# Patient Record
Sex: Male | Born: 1970 | Race: White | Hispanic: No | Smoking: Current every day smoker
Health system: Southern US, Community
[De-identification: ages and names within clinical notes are randomized; demographics above are authoritative.]

## PROBLEM LIST (undated history)

## (undated) DIAGNOSIS — S81802A Unspecified open wound, left lower leg, initial encounter: Secondary | ICD-10-CM

## (undated) DIAGNOSIS — S82402A Unspecified fracture of shaft of left fibula, initial encounter for closed fracture: Secondary | ICD-10-CM

## (undated) DIAGNOSIS — S143XXA Injury of brachial plexus, initial encounter: Secondary | ICD-10-CM

## (undated) DIAGNOSIS — S32811B Multiple fractures of pelvis with unstable disruption of pelvic ring, initial encounter for open fracture: Secondary | ICD-10-CM

## (undated) DIAGNOSIS — S3210XA Unspecified fracture of sacrum, initial encounter for closed fracture: Secondary | ICD-10-CM

## (undated) HISTORY — PX: WISDOM TOOTH EXTRACTION: SHX21

## (undated) HISTORY — PX: CHOLECYSTECTOMY: SHX55

---

## 2010-02-09 ENCOUNTER — Emergency Department (HOSPITAL_BASED_OUTPATIENT_CLINIC_OR_DEPARTMENT_OTHER): Admission: EM | Admit: 2010-02-09 | Discharge: 2010-02-09 | Payer: Self-pay | Admitting: Emergency Medicine

## 2017-03-30 ENCOUNTER — Inpatient Hospital Stay (HOSPITAL_COMMUNITY): Payer: Commercial Managed Care - PPO | Admitting: Certified Registered Nurse Anesthetist

## 2017-03-30 ENCOUNTER — Emergency Department (HOSPITAL_COMMUNITY): Payer: Commercial Managed Care - PPO

## 2017-03-30 ENCOUNTER — Inpatient Hospital Stay (HOSPITAL_COMMUNITY): Payer: Commercial Managed Care - PPO

## 2017-03-30 ENCOUNTER — Encounter (HOSPITAL_COMMUNITY): Admission: EM | Disposition: A | Discharge status: Home / Self Care | Payer: Self-pay | Source: Home / Self Care

## 2017-03-30 ENCOUNTER — Encounter (HOSPITAL_COMMUNITY): Payer: Self-pay | Admitting: Emergency Medicine

## 2017-03-30 ENCOUNTER — Inpatient Hospital Stay (HOSPITAL_COMMUNITY)
Admission: EM | Admit: 2017-03-30 | Discharge: 2017-04-05 | DRG: 958 | Disposition: A | Discharge status: Home / Self Care | Payer: Commercial Managed Care - PPO | Attending: Orthopedic Surgery | Admitting: Orthopedic Surgery

## 2017-03-30 DIAGNOSIS — D72828 Other elevated white blood cell count: Secondary | ICD-10-CM | POA: Diagnosis not present

## 2017-03-30 DIAGNOSIS — S143XXA Injury of brachial plexus, initial encounter: Secondary | ICD-10-CM | POA: Diagnosis present

## 2017-03-30 DIAGNOSIS — R339 Retention of urine, unspecified: Secondary | ICD-10-CM | POA: Diagnosis not present

## 2017-03-30 DIAGNOSIS — S143XXD Injury of brachial plexus, subsequent encounter: Secondary | ICD-10-CM | POA: Diagnosis not present

## 2017-03-30 DIAGNOSIS — K5900 Constipation, unspecified: Secondary | ICD-10-CM | POA: Diagnosis present

## 2017-03-30 DIAGNOSIS — S32130A Nondisplaced Zone III fracture of sacrum, initial encounter for closed fracture: Secondary | ICD-10-CM | POA: Diagnosis present

## 2017-03-30 DIAGNOSIS — R509 Fever, unspecified: Secondary | ICD-10-CM

## 2017-03-30 DIAGNOSIS — S82402A Unspecified fracture of shaft of left fibula, initial encounter for closed fracture: Secondary | ICD-10-CM | POA: Diagnosis present

## 2017-03-30 DIAGNOSIS — S81822A Laceration with foreign body, left lower leg, initial encounter: Secondary | ICD-10-CM | POA: Diagnosis present

## 2017-03-30 DIAGNOSIS — R791 Abnormal coagulation profile: Secondary | ICD-10-CM | POA: Diagnosis not present

## 2017-03-30 DIAGNOSIS — R030 Elevated blood-pressure reading, without diagnosis of hypertension: Secondary | ICD-10-CM | POA: Diagnosis not present

## 2017-03-30 DIAGNOSIS — Z720 Tobacco use: Secondary | ICD-10-CM

## 2017-03-30 DIAGNOSIS — S332XXA Dislocation of sacroiliac and sacrococcygeal joint, initial encounter: Secondary | ICD-10-CM | POA: Diagnosis present

## 2017-03-30 DIAGNOSIS — S40811A Abrasion of right upper arm, initial encounter: Secondary | ICD-10-CM | POA: Diagnosis present

## 2017-03-30 DIAGNOSIS — S3210XA Unspecified fracture of sacrum, initial encounter for closed fracture: Secondary | ICD-10-CM | POA: Diagnosis present

## 2017-03-30 DIAGNOSIS — D62 Acute posthemorrhagic anemia: Secondary | ICD-10-CM

## 2017-03-30 DIAGNOSIS — N390 Urinary tract infection, site not specified: Secondary | ICD-10-CM | POA: Diagnosis present

## 2017-03-30 DIAGNOSIS — G8918 Other acute postprocedural pain: Secondary | ICD-10-CM | POA: Diagnosis not present

## 2017-03-30 DIAGNOSIS — S12500D Unspecified displaced fracture of sixth cervical vertebra, subsequent encounter for fracture with routine healing: Secondary | ICD-10-CM | POA: Diagnosis not present

## 2017-03-30 DIAGNOSIS — J189 Pneumonia, unspecified organism: Secondary | ICD-10-CM | POA: Diagnosis present

## 2017-03-30 DIAGNOSIS — E871 Hypo-osmolality and hyponatremia: Secondary | ICD-10-CM | POA: Diagnosis present

## 2017-03-30 DIAGNOSIS — S32599A Other specified fracture of unspecified pubis, initial encounter for closed fracture: Secondary | ICD-10-CM

## 2017-03-30 DIAGNOSIS — S12590A Other displaced fracture of sixth cervical vertebra, initial encounter for closed fracture: Secondary | ICD-10-CM | POA: Diagnosis present

## 2017-03-30 DIAGNOSIS — R739 Hyperglycemia, unspecified: Secondary | ICD-10-CM | POA: Diagnosis present

## 2017-03-30 DIAGNOSIS — S0081XD Abrasion of other part of head, subsequent encounter: Secondary | ICD-10-CM | POA: Diagnosis not present

## 2017-03-30 DIAGNOSIS — Z419 Encounter for procedure for purposes other than remedying health state, unspecified: Secondary | ICD-10-CM

## 2017-03-30 DIAGNOSIS — S142XXA Injury of nerve root of cervical spine, initial encounter: Secondary | ICD-10-CM | POA: Diagnosis present

## 2017-03-30 DIAGNOSIS — Y95 Nosocomial condition: Secondary | ICD-10-CM | POA: Diagnosis present

## 2017-03-30 DIAGNOSIS — S0181XA Laceration without foreign body of other part of head, initial encounter: Secondary | ICD-10-CM | POA: Diagnosis present

## 2017-03-30 DIAGNOSIS — D72829 Elevated white blood cell count, unspecified: Secondary | ICD-10-CM | POA: Diagnosis not present

## 2017-03-30 DIAGNOSIS — S32811A Multiple fractures of pelvis with unstable disruption of pelvic ring, initial encounter for closed fracture: Principal | ICD-10-CM | POA: Diagnosis present

## 2017-03-30 DIAGNOSIS — E8809 Other disorders of plasma-protein metabolism, not elsewhere classified: Secondary | ICD-10-CM | POA: Diagnosis present

## 2017-03-30 DIAGNOSIS — D72825 Bandemia: Secondary | ICD-10-CM | POA: Diagnosis not present

## 2017-03-30 DIAGNOSIS — Z23 Encounter for immunization: Secondary | ICD-10-CM

## 2017-03-30 DIAGNOSIS — S82455B Nondisplaced comminuted fracture of shaft of left fibula, initial encounter for open fracture type I or II: Secondary | ICD-10-CM

## 2017-03-30 DIAGNOSIS — S12500A Unspecified displaced fracture of sixth cervical vertebra, initial encounter for closed fracture: Secondary | ICD-10-CM

## 2017-03-30 DIAGNOSIS — S12591S Other nondisplaced fracture of sixth cervical vertebra, sequela: Secondary | ICD-10-CM | POA: Diagnosis not present

## 2017-03-30 DIAGNOSIS — S82452A Displaced comminuted fracture of shaft of left fibula, initial encounter for closed fracture: Secondary | ICD-10-CM | POA: Diagnosis present

## 2017-03-30 DIAGNOSIS — S0003XD Contusion of scalp, subsequent encounter: Secondary | ICD-10-CM | POA: Diagnosis not present

## 2017-03-30 DIAGNOSIS — R7989 Other specified abnormal findings of blood chemistry: Secondary | ICD-10-CM

## 2017-03-30 DIAGNOSIS — I959 Hypotension, unspecified: Secondary | ICD-10-CM | POA: Diagnosis present

## 2017-03-30 DIAGNOSIS — S82402D Unspecified fracture of shaft of left fibula, subsequent encounter for closed fracture with routine healing: Secondary | ICD-10-CM | POA: Diagnosis not present

## 2017-03-30 DIAGNOSIS — F17210 Nicotine dependence, cigarettes, uncomplicated: Secondary | ICD-10-CM | POA: Diagnosis present

## 2017-03-30 DIAGNOSIS — S242XXA Injury of nerve root of thoracic spine, initial encounter: Secondary | ICD-10-CM | POA: Diagnosis present

## 2017-03-30 DIAGNOSIS — R740 Nonspecific elevation of levels of transaminase and lactic acid dehydrogenase [LDH]: Secondary | ICD-10-CM | POA: Diagnosis not present

## 2017-03-30 DIAGNOSIS — E46 Unspecified protein-calorie malnutrition: Secondary | ICD-10-CM | POA: Diagnosis not present

## 2017-03-30 DIAGNOSIS — S3210XD Unspecified fracture of sacrum, subsequent encounter for fracture with routine healing: Secondary | ICD-10-CM | POA: Diagnosis not present

## 2017-03-30 DIAGNOSIS — S143XXS Injury of brachial plexus, sequela: Secondary | ICD-10-CM | POA: Diagnosis not present

## 2017-03-30 DIAGNOSIS — S32810D Multiple fractures of pelvis with stable disruption of pelvic ring, subsequent encounter for fracture with routine healing: Secondary | ICD-10-CM | POA: Diagnosis present

## 2017-03-30 DIAGNOSIS — S32811B Multiple fractures of pelvis with unstable disruption of pelvic ring, initial encounter for open fracture: Secondary | ICD-10-CM | POA: Diagnosis present

## 2017-03-30 DIAGNOSIS — B9620 Unspecified Escherichia coli [E. coli] as the cause of diseases classified elsewhere: Secondary | ICD-10-CM | POA: Diagnosis present

## 2017-03-30 DIAGNOSIS — F1010 Alcohol abuse, uncomplicated: Secondary | ICD-10-CM

## 2017-03-30 DIAGNOSIS — M792 Neuralgia and neuritis, unspecified: Secondary | ICD-10-CM | POA: Diagnosis not present

## 2017-03-30 DIAGNOSIS — S12500S Unspecified displaced fracture of sixth cervical vertebra, sequela: Secondary | ICD-10-CM | POA: Diagnosis not present

## 2017-03-30 DIAGNOSIS — R8290 Unspecified abnormal findings in urine: Secondary | ICD-10-CM | POA: Diagnosis not present

## 2017-03-30 DIAGNOSIS — M25511 Pain in right shoulder: Secondary | ICD-10-CM | POA: Diagnosis present

## 2017-03-30 DIAGNOSIS — T148XXA Other injury of unspecified body region, initial encounter: Secondary | ICD-10-CM

## 2017-03-30 DIAGNOSIS — S81802A Unspecified open wound, left lower leg, initial encounter: Secondary | ICD-10-CM | POA: Diagnosis present

## 2017-03-30 HISTORY — DX: Unspecified open wound, left lower leg, initial encounter: S81.802A

## 2017-03-30 HISTORY — DX: Multiple fractures of pelvis with unstable disruption of pelvic ring, initial encounter for open fracture: S32.811B

## 2017-03-30 HISTORY — PX: SACRO-ILIAC PINNING: SHX5050

## 2017-03-30 HISTORY — DX: Unspecified fracture of sacrum, initial encounter for closed fracture: S32.10XA

## 2017-03-30 HISTORY — PX: ORIF PELVIC FRACTURE: SHX2128

## 2017-03-30 HISTORY — DX: Injury of brachial plexus, initial encounter: S14.3XXA

## 2017-03-30 HISTORY — DX: Unspecified fracture of shaft of left fibula, initial encounter for closed fracture: S82.402A

## 2017-03-30 LAB — COMPREHENSIVE METABOLIC PANEL
ALBUMIN: 3.5 g/dL (ref 3.5–5.0)
ALBUMIN: 3.3 g/dL — AB (ref 3.5–5.0)
ALT: 50 U/L (ref 17–63)
ALT: 164 U/L — AB (ref 17–63)
ANION GAP: 12 (ref 5–15)
AST: 114 U/L — ABNORMAL HIGH (ref 15–41)
AST: 313 U/L — AB (ref 15–41)
Alkaline Phosphatase: 61 U/L (ref 38–126)
Alkaline Phosphatase: 39 U/L (ref 38–126)
Anion gap: 6 (ref 5–15)
BILIRUBIN TOTAL: 1.5 mg/dL — AB (ref 0.3–1.2)
BILIRUBIN TOTAL: 1.8 mg/dL — AB (ref 0.3–1.2)
BUN: 10 mg/dL (ref 6–20)
BUN: 8 mg/dL (ref 6–20)
CALCIUM: 8.2 mg/dL — AB (ref 8.9–10.3)
CHLORIDE: 108 mmol/L (ref 101–111)
CO2: 19 mmol/L — ABNORMAL LOW (ref 22–32)
CO2: 21 mmol/L — ABNORMAL LOW (ref 22–32)
Calcium: 7.8 mg/dL — ABNORMAL LOW (ref 8.9–10.3)
Chloride: 105 mmol/L (ref 101–111)
Creatinine, Ser: 0.88 mg/dL (ref 0.61–1.24)
Creatinine, Ser: 0.83 mg/dL (ref 0.61–1.24)
GFR calc Af Amer: >60 mL/min (ref >60)
GFR calc Af Amer: >60 mL/min (ref >60)
GFR calc non Af Amer: >60 mL/min (ref >60)
GLUCOSE: 125 mg/dL — AB (ref 65–99)
GLUCOSE: 168 mg/dL — AB (ref 65–99)
POTASSIUM: 4.6 mmol/L (ref 3.5–5.1)
POTASSIUM: 3.8 mmol/L (ref 3.5–5.1)
Sodium: 136 mmol/L (ref 135–145)
Sodium: 135 mmol/L (ref 135–145)
TOTAL PROTEIN: 5.3 g/dL — AB (ref 6.5–8.1)
Total Protein: 4.9 g/dL — ABNORMAL LOW (ref 6.5–8.1)

## 2017-03-30 LAB — CDS SEROLOGY

## 2017-03-30 LAB — ABO/RH: ABO/RH(D): O NEG

## 2017-03-30 LAB — PREPARE RBC (CROSSMATCH)

## 2017-03-30 LAB — CBC
HCT: 38.8 % — ABNORMAL LOW (ref 39.0–52.0)
HEMATOCRIT: 33.8 % — AB (ref 39.0–52.0)
HEMOGLOBIN: 13.3 g/dL (ref 13.0–17.0)
Hemoglobin: 11.7 g/dL — ABNORMAL LOW (ref 13.0–17.0)
MCH: 32.2 pg (ref 26.0–34.0)
MCH: 30.8 pg (ref 26.0–34.0)
MCHC: 34.3 g/dL (ref 30.0–36.0)
MCHC: 34.6 g/dL (ref 30.0–36.0)
MCV: 93.9 fL (ref 78.0–100.0)
MCV: 88.9 fL (ref 78.0–100.0)
PLATELETS: 145 10*3/uL — AB (ref 150–400)
Platelets: 237 10*3/uL (ref 150–400)
RBC: 4.13 MIL/uL — ABNORMAL LOW (ref 4.22–5.81)
RBC: 3.8 MIL/uL — AB (ref 4.22–5.81)
RDW: 13 % (ref 11.5–15.5)
RDW: 16.1 % — AB (ref 11.5–15.5)
WBC: 22 10*3/uL — AB (ref 4.0–10.5)
WBC: 10.2 10*3/uL (ref 4.0–10.5)

## 2017-03-30 LAB — LACTIC ACID, PLASMA: LACTIC ACID, VENOUS: 2.8 mmol/L — AB (ref 0.5–1.9)

## 2017-03-30 LAB — ETHANOL

## 2017-03-30 LAB — SAMPLE TO BLOOD BANK

## 2017-03-30 LAB — DIFFERENTIAL
Basophils Absolute: 0.1 10*3/uL (ref 0.0–0.1)
Basophils Relative: 0 %
EOS ABS: 0.3 10*3/uL (ref 0.0–0.7)
EOS PCT: 1 %
LYMPHS PCT: 22 %
Lymphs Abs: 4.8 10*3/uL — ABNORMAL HIGH (ref 0.7–4.0)
MONO ABS: 1.8 10*3/uL — AB (ref 0.1–1.0)
Monocytes Relative: 8 %
Neutro Abs: 15.2 10*3/uL — ABNORMAL HIGH (ref 1.7–7.7)
Neutrophils Relative %: 69 %

## 2017-03-30 LAB — I-STAT CG4 LACTIC ACID, ED: Lactic Acid, Venous: 2.96 mmol/L (ref 0.5–1.9)

## 2017-03-30 LAB — MRSA PCR SCREENING: MRSA by PCR: POSITIVE — AB

## 2017-03-30 LAB — PROTIME-INR
INR: 1.08
PROTHROMBIN TIME: 14 s (ref 11.4–15.2)

## 2017-03-30 SURGERY — OPEN REDUCTION INTERNAL FIXATION (ORIF) PELVIC FRACTURE
Anesthesia: General | Laterality: Left

## 2017-03-30 MED ORDER — PROMETHAZINE HCL 25 MG/ML IJ SOLN: 6.2500 mg | INTRAMUSCULAR | Status: DC | PRN

## 2017-03-30 MED ORDER — IOPAMIDOL (ISOVUE-300) INJECTION 61%
INTRAVENOUS | Status: AC
  Administered 2017-03-30: 100 mL
  Filled 2017-03-30: qty 100

## 2017-03-30 MED ORDER — FENTANYL CITRATE (PF) 100 MCG/2ML IJ SOLN
50.0000 ug | Freq: Once | INTRAMUSCULAR | Status: AC
  Administered 2017-03-30: 50 ug via INTRAVENOUS

## 2017-03-30 MED ORDER — ALBUMIN HUMAN 5 % IV SOLN
12.5000 g | Freq: Once | INTRAVENOUS | Status: AC
  Administered 2017-03-30: 12.5 g via INTRAVENOUS
  Filled 2017-03-30: qty 250

## 2017-03-30 MED ORDER — ONDANSETRON HCL 4 MG/2ML IJ SOLN
INTRAMUSCULAR | Status: DC | PRN
  Administered 2017-03-30: 4 mg via INTRAVENOUS

## 2017-03-30 MED ORDER — HYDROMORPHONE HCL 1 MG/ML IJ SOLN
1.0000 mg | INTRAMUSCULAR | Status: DC | PRN
  Administered 2017-03-30: 1 mg via INTRAVENOUS

## 2017-03-30 MED ORDER — MUPIROCIN 2 % EX OINT
1.0000 "application " | TOPICAL_OINTMENT | Freq: Two times a day (BID) | CUTANEOUS | Status: AC
  Administered 2017-03-31 – 2017-04-04 (×10): 1 via NASAL
  Filled 2017-03-30 (×2): qty 22

## 2017-03-30 MED ORDER — CHLORHEXIDINE GLUCONATE CLOTH 2 % EX PADS
6.0000 | MEDICATED_PAD | Freq: Every day | CUTANEOUS | Status: AC
  Administered 2017-03-31 – 2017-04-04 (×5): 6 via TOPICAL

## 2017-03-30 MED ORDER — ONDANSETRON 4 MG PO TBDP
4.0000 mg | ORAL_TABLET | Freq: Four times a day (QID) | ORAL | Status: DC | PRN
  Administered 2017-04-03: 4 mg via ORAL
  Filled 2017-03-30 (×2): qty 1

## 2017-03-30 MED ORDER — SODIUM CHLORIDE 0.9 % IV BOLUS (SEPSIS)
1000.0000 mL | Freq: Once | INTRAVENOUS | Status: AC
  Administered 2017-03-30: 1000 mL via INTRAVENOUS

## 2017-03-30 MED ORDER — CEFAZOLIN SODIUM-DEXTROSE 1-4 GM/50ML-% IV SOLN
1.0000 g | Freq: Once | INTRAVENOUS | Status: AC
  Administered 2017-03-30: 1 g via INTRAVENOUS
  Filled 2017-03-30: qty 50

## 2017-03-30 MED ORDER — ONDANSETRON HCL 4 MG/2ML IJ SOLN
4.0000 mg | Freq: Once | INTRAMUSCULAR | Status: AC
  Administered 2017-03-30: 4 mg via INTRAVENOUS
  Filled 2017-03-30: qty 2

## 2017-03-30 MED ORDER — ROCURONIUM BROMIDE 100 MG/10ML IV SOLN
INTRAVENOUS | Status: DC | PRN
  Administered 2017-03-30: 10 mg via INTRAVENOUS
  Administered 2017-03-30: 20 mg via INTRAVENOUS
  Administered 2017-03-30: 30 mg via INTRAVENOUS
  Administered 2017-03-30: 50 mg via INTRAVENOUS

## 2017-03-30 MED ORDER — DIPHENHYDRAMINE HCL 12.5 MG/5ML PO ELIX: 12.5000 mg | ORAL_SOLUTION | Freq: Four times a day (QID) | ORAL | Status: DC | PRN

## 2017-03-30 MED ORDER — SODIUM CHLORIDE 0.9% FLUSH: 9.0000 mL | INTRAVENOUS | Status: DC | PRN

## 2017-03-30 MED ORDER — LACTATED RINGERS IV SOLN
INTRAVENOUS | Status: DC
  Administered 2017-03-30 (×2): via INTRAVENOUS

## 2017-03-30 MED ORDER — ONDANSETRON HCL 4 MG/2ML IJ SOLN
4.0000 mg | Freq: Four times a day (QID) | INTRAMUSCULAR | Status: DC | PRN
Start: 2017-03-30 — End: 2017-04-05
  Administered 2017-03-30: 4 mg via INTRAVENOUS
  Filled 2017-03-30: qty 2

## 2017-03-30 MED ORDER — FENTANYL CITRATE (PF) 100 MCG/2ML IJ SOLN
INTRAMUSCULAR | Status: DC | PRN
  Administered 2017-03-30 (×5): 50 ug via INTRAVENOUS

## 2017-03-30 MED ORDER — HYDROMORPHONE HCL 1 MG/ML IJ SOLN
INTRAMUSCULAR | Status: AC
  Filled 2017-03-30: qty 1

## 2017-03-30 MED ORDER — LIDOCAINE-EPINEPHRINE (PF) 2 %-1:200000 IJ SOLN
10.0000 mL | Freq: Once | INTRAMUSCULAR | Status: AC
  Administered 2017-03-30: 10 mL

## 2017-03-30 MED ORDER — TETANUS-DIPHTH-ACELL PERTUSSIS 5-2.5-18.5 LF-MCG/0.5 IM SUSP
0.5000 mL | Freq: Once | INTRAMUSCULAR | Status: AC
  Administered 2017-03-30: 0.5 mL via INTRAMUSCULAR
  Filled 2017-03-30: qty 0.5

## 2017-03-30 MED ORDER — SODIUM CHLORIDE 0.9 % IR SOLN
Status: DC | PRN
  Administered 2017-03-30: 3000 mL

## 2017-03-30 MED ORDER — KCL IN DEXTROSE-NACL 20-5-0.45 MEQ/L-%-% IV SOLN
INTRAVENOUS | Status: DC
  Administered 2017-03-30 – 2017-04-02 (×3): via INTRAVENOUS
  Filled 2017-03-30 (×6): qty 1000

## 2017-03-30 MED ORDER — HYDROMORPHONE 1 MG/ML IV SOLN
INTRAVENOUS | Status: DC
  Administered 2017-03-30: 25 mg via INTRAVENOUS
  Administered 2017-03-31: 1.2 mg via INTRAVENOUS
  Administered 2017-03-31: 3 mg via INTRAVENOUS
  Administered 2017-03-31: 2 mg via INTRAVENOUS
  Administered 2017-04-01 (×2): 1.8 mg via INTRAVENOUS
  Administered 2017-04-01: 0.9 mg via INTRAVENOUS
  Filled 2017-03-30: qty 25

## 2017-03-30 MED ORDER — SUGAMMADEX SODIUM 200 MG/2ML IV SOLN
INTRAVENOUS | Status: DC | PRN
  Administered 2017-03-30: 200 mg via INTRAVENOUS

## 2017-03-30 MED ORDER — HYDRALAZINE HCL 20 MG/ML IJ SOLN: 10.0000 mg | INTRAMUSCULAR | Status: DC | PRN

## 2017-03-30 MED ORDER — HYDROMORPHONE HCL 1 MG/ML IJ SOLN
0.5000 mg | INTRAMUSCULAR | Status: DC | PRN
  Administered 2017-03-30: 0.5 mg via INTRAVENOUS
  Filled 2017-03-30: qty 0.5

## 2017-03-30 MED ORDER — CEFAZOLIN SODIUM-DEXTROSE 2-4 GM/100ML-% IV SOLN
2.0000 g | INTRAVENOUS | Status: AC
  Administered 2017-03-30: 2 g via INTRAVENOUS

## 2017-03-30 MED ORDER — CEFAZOLIN SODIUM-DEXTROSE 2-4 GM/100ML-% IV SOLN
INTRAVENOUS | Status: AC
  Filled 2017-03-30: qty 100

## 2017-03-30 MED ORDER — PHENYLEPHRINE HCL 10 MG/ML IJ SOLN
INTRAVENOUS | Status: DC | PRN
  Administered 2017-03-30: 80 ug/min via INTRAVENOUS

## 2017-03-30 MED ORDER — MIDAZOLAM HCL 5 MG/5ML IJ SOLN
INTRAMUSCULAR | Status: DC | PRN
  Administered 2017-03-30: 2 mg via INTRAVENOUS

## 2017-03-30 MED ORDER — BISACODYL 10 MG RE SUPP: 10.0000 mg | Freq: Every day | RECTAL | Status: DC | PRN

## 2017-03-30 MED ORDER — CHLORHEXIDINE GLUCONATE 4 % EX LIQD: 60.0000 mL | Freq: Once | CUTANEOUS | Status: DC

## 2017-03-30 MED ORDER — PANTOPRAZOLE SODIUM 40 MG IV SOLR
40.0000 mg | Freq: Every day | INTRAVENOUS | Status: DC
  Administered 2017-03-31: 40 mg via INTRAVENOUS
  Filled 2017-03-30: qty 40

## 2017-03-30 MED ORDER — FENTANYL CITRATE (PF) 100 MCG/2ML IJ SOLN
INTRAMUSCULAR | Status: AC
  Filled 2017-03-30: qty 2

## 2017-03-30 MED ORDER — MIDAZOLAM HCL 2 MG/2ML IJ SOLN
INTRAMUSCULAR | Status: AC
  Filled 2017-03-30: qty 2

## 2017-03-30 MED ORDER — CEFAZOLIN SODIUM-DEXTROSE 1-4 GM/50ML-% IV SOLN
1.0000 g | Freq: Three times a day (TID) | INTRAVENOUS | Status: AC
  Administered 2017-03-30 – 2017-04-02 (×9): 1 g via INTRAVENOUS
  Filled 2017-03-30 (×11): qty 50

## 2017-03-30 MED ORDER — DEXAMETHASONE SODIUM PHOSPHATE 10 MG/ML IJ SOLN
INTRAMUSCULAR | Status: DC | PRN
  Administered 2017-03-30: 10 mg via INTRAVENOUS

## 2017-03-30 MED ORDER — ALBUMIN HUMAN 5 % IV SOLN
INTRAVENOUS | Status: DC | PRN
  Administered 2017-03-30 (×2): via INTRAVENOUS

## 2017-03-30 MED ORDER — MORPHINE SULFATE (PF) 4 MG/ML IV SOLN
4.0000 mg | INTRAVENOUS | Status: DC | PRN
  Administered 2017-03-30 (×2): 4 mg via INTRAVENOUS
  Filled 2017-03-30 (×2): qty 1

## 2017-03-30 MED ORDER — EPHEDRINE 5 MG/ML INJ
INTRAVENOUS | Status: AC
  Filled 2017-03-30: qty 10

## 2017-03-30 MED ORDER — FENTANYL CITRATE (PF) 250 MCG/5ML IJ SOLN
INTRAMUSCULAR | Status: AC
  Filled 2017-03-30: qty 5

## 2017-03-30 MED ORDER — ONDANSETRON HCL 4 MG/2ML IJ SOLN
INTRAMUSCULAR | Status: AC
  Filled 2017-03-30: qty 2

## 2017-03-30 MED ORDER — SUCCINYLCHOLINE CHLORIDE 200 MG/10ML IV SOSY
PREFILLED_SYRINGE | INTRAVENOUS | Status: AC
  Filled 2017-03-30: qty 10

## 2017-03-30 MED ORDER — LACTATED RINGERS IV SOLN: INTRAVENOUS | Status: DC | PRN

## 2017-03-30 MED ORDER — HYDROMORPHONE HCL 1 MG/ML IJ SOLN: 0.2500 mg | INTRAMUSCULAR | Status: DC | PRN

## 2017-03-30 MED ORDER — DIPHENHYDRAMINE HCL 50 MG/ML IJ SOLN: 12.5000 mg | Freq: Four times a day (QID) | INTRAMUSCULAR | Status: DC | PRN

## 2017-03-30 MED ORDER — PROPOFOL 10 MG/ML IV BOLUS
INTRAVENOUS | Status: DC | PRN
  Administered 2017-03-30: 100 mg via INTRAVENOUS

## 2017-03-30 MED ORDER — SUCCINYLCHOLINE CHLORIDE 200 MG/10ML IV SOSY
PREFILLED_SYRINGE | INTRAVENOUS | Status: DC | PRN
  Administered 2017-03-30: 100 mg via INTRAVENOUS

## 2017-03-30 MED ORDER — FENTANYL CITRATE (PF) 100 MCG/2ML IJ SOLN
INTRAMUSCULAR | Status: AC
  Administered 2017-03-30: 100 ug via INTRAVENOUS
  Filled 2017-03-30: qty 2

## 2017-03-30 MED ORDER — PANTOPRAZOLE SODIUM 40 MG PO TBEC
40.0000 mg | DELAYED_RELEASE_TABLET | Freq: Every day | ORAL | Status: DC
  Administered 2017-04-01 – 2017-04-05 (×5): 40 mg via ORAL
  Filled 2017-03-30 (×5): qty 1

## 2017-03-30 MED ORDER — PROPOFOL 10 MG/ML IV BOLUS
INTRAVENOUS | Status: AC
  Filled 2017-03-30: qty 20

## 2017-03-30 MED ORDER — LIDOCAINE HCL (CARDIAC) 20 MG/ML IV SOLN
INTRAVENOUS | Status: DC | PRN
  Administered 2017-03-30: 80 mg via INTRAVENOUS

## 2017-03-30 MED ORDER — FENTANYL CITRATE (PF) 100 MCG/2ML IJ SOLN
100.0000 ug | Freq: Once | INTRAMUSCULAR | Status: AC
  Administered 2017-03-30: 100 ug via INTRAVENOUS

## 2017-03-30 MED ORDER — NALOXONE HCL 0.4 MG/ML IJ SOLN: 0.4000 mg | INTRAMUSCULAR | Status: DC | PRN

## 2017-03-30 MED ORDER — SODIUM CHLORIDE 0.9 % IV SOLN
Freq: Once | INTRAVENOUS | Status: AC
  Administered 2017-03-30: 14:00:00 via INTRAVENOUS

## 2017-03-30 SURGICAL SUPPLY — 76 items
BIT DRILL 5.6 (BIT) ×1 IMPLANT
BIT DRILL SCALD PELVS II 2.5MM (BIT) ×1 IMPLANT
BLADE CLIPPER SURG (BLADE) ×6 IMPLANT
BRUSH SCRUB SURG 4.25 DISP (MISCELLANEOUS) ×12 IMPLANT
COVER SURGICAL LIGHT HANDLE (MISCELLANEOUS) ×6 IMPLANT
DRAIN CHANNEL 15F RND FF W/TCR (WOUND CARE) IMPLANT
DRAPE C-ARM 42X72 X-RAY (DRAPES) ×3 IMPLANT
DRAPE C-ARMOR (DRAPES) ×3 IMPLANT
DRAPE EXTREMITY T 121X128X90 (DRAPE) ×3 IMPLANT
DRAPE INCISE IOBAN 66X45 STRL (DRAPES) ×3 IMPLANT
DRAPE LAPAROTOMY TRNSV 102X78 (DRAPE) ×3 IMPLANT
DRAPE PROXIMA HALF (DRAPES) ×6 IMPLANT
DRAPE SURG 17X23 STRL (DRAPES) ×3 IMPLANT
DRAPE U-SHAPE 47X51 STRL (DRAPES) ×3 IMPLANT
DRILL 5.6 (BIT) ×3
DRILL SCALED PELVIS II 2.5MM (BIT) ×3
DRSG MEPILEX BORDER 4X4 (GAUZE/BANDAGES/DRESSINGS) ×3 IMPLANT
DRSG MEPITEL 4X7.2 (GAUZE/BANDAGES/DRESSINGS) ×3 IMPLANT
DRSG VAC ATS MED SENSATRAC (GAUZE/BANDAGES/DRESSINGS) ×3 IMPLANT
ELECT BLADE 4.0 EZ CLEAN MEGAD (MISCELLANEOUS) ×3
ELECT REM PT RETURN 9FT ADLT (ELECTROSURGICAL) ×3
ELECTRODE BLDE 4.0 EZ CLN MEGD (MISCELLANEOUS) ×1 IMPLANT
ELECTRODE REM PT RTRN 9FT ADLT (ELECTROSURGICAL) ×1 IMPLANT
EVACUATOR SILICONE 100CC (DRAIN) IMPLANT
GLOVE BIO SURGEON STRL SZ7.5 (GLOVE) ×6 IMPLANT
GLOVE BIO SURGEON STRL SZ8 (GLOVE) ×6 IMPLANT
GLOVE BIOGEL PI IND STRL 7.5 (GLOVE) ×2 IMPLANT
GLOVE BIOGEL PI IND STRL 8 (GLOVE) ×2 IMPLANT
GLOVE BIOGEL PI INDICATOR 7.5 (GLOVE) ×4
GLOVE BIOGEL PI INDICATOR 8 (GLOVE) ×4
GOWN STRL REUS W/ TWL LRG LVL3 (GOWN DISPOSABLE) ×4 IMPLANT
GOWN STRL REUS W/ TWL XL LVL3 (GOWN DISPOSABLE) ×2 IMPLANT
GOWN STRL REUS W/TWL LRG LVL3 (GOWN DISPOSABLE) ×8
GOWN STRL REUS W/TWL XL LVL3 (GOWN DISPOSABLE) ×4
GUIDEWIRE ASNIS 3.2 NONCAL (WIRE) ×9 IMPLANT
HANDPIECE INTERPULSE COAX TIP (DISPOSABLE) ×2
IV NS IRRIG 3000ML ARTHROMATIC (IV SOLUTION) ×3 IMPLANT
KIT BASIN OR (CUSTOM PROCEDURE TRAY) ×3 IMPLANT
KIT ROOM TURNOVER OR (KITS) ×3 IMPLANT
MANIFOLD NEPTUNE II (INSTRUMENTS) ×3 IMPLANT
NS IRRIG 1000ML POUR BTL (IV SOLUTION) ×3 IMPLANT
PACK GENERAL/GYN (CUSTOM PROCEDURE TRAY) ×3 IMPLANT
PACK TOTAL JOINT (CUSTOM PROCEDURE TRAY) ×3 IMPLANT
PAD ARMBOARD 7.5X6 YLW CONV (MISCELLANEOUS) ×6 IMPLANT
PLATE SYMPHYSIS 92.5M 6H (Plate) ×3 IMPLANT
SCREW 3.5X46MM (Screw) ×3 IMPLANT
SCREW BONE CANN 8.0X155MM (Screw) ×3 IMPLANT
SCREW CANNULATED 8.0X145MM (Screw) ×3 IMPLANT
SCREW CANNULATED 8.0X170MM (Screw) ×3 IMPLANT
SCREW CORTEX ST MATTA 3.5X34MM (Screw) ×3 IMPLANT
SCREW CORTEX ST MATTA 3.5X36MM (Screw) ×3 IMPLANT
SCREW CORTEX ST MATTA 3.5X38M (Screw) ×3 IMPLANT
SCREW CORTEX ST MATTA 3.5X50MM (Screw) ×6 IMPLANT
SET HNDPC FAN SPRY TIP SCT (DISPOSABLE) ×1 IMPLANT
SPONGE LAP 18X18 X RAY DECT (DISPOSABLE) ×3 IMPLANT
STAPLER VISISTAT 35W (STAPLE) ×3 IMPLANT
SUCTION FRAZIER HANDLE 10FR (MISCELLANEOUS) ×2
SUCTION TUBE FRAZIER 10FR DISP (MISCELLANEOUS) ×1 IMPLANT
SUT ETHILON 2 0 PSLX (SUTURE) ×6 IMPLANT
SUT ETHILON 3 0 PS 1 (SUTURE) IMPLANT
SUT PDS AB 2-0 CT1 27 (SUTURE) ×3 IMPLANT
SUT VIC AB 0 CT1 27 (SUTURE) ×4
SUT VIC AB 0 CT1 27XBRD ANBCTR (SUTURE) ×2 IMPLANT
SUT VIC AB 1 CT1 18XCR BRD 8 (SUTURE) IMPLANT
SUT VIC AB 1 CT1 8-18 (SUTURE)
SUT VIC AB 2-0 CT1 27 (SUTURE)
SUT VIC AB 2-0 CT1 TAPERPNT 27 (SUTURE) IMPLANT
SUT VIC AB 2-0 FS1 27 (SUTURE) IMPLANT
TOWEL OR 17X24 6PK STRL BLUE (TOWEL DISPOSABLE) ×3 IMPLANT
TOWEL OR 17X26 10 PK STRL BLUE (TOWEL DISPOSABLE) ×6 IMPLANT
TRAY FOLEY W/METER SILVER 16FR (SET/KITS/TRAYS/PACK) ×3 IMPLANT
UNDERPAD 30X30 (UNDERPADS AND DIAPERS) ×6 IMPLANT
WASHER SCREW MATTA SS 13.0X1.5 (Washer) ×6 IMPLANT
WATER STERILE IRR 1000ML POUR (IV SOLUTION) ×3 IMPLANT
WND VAC CANISTER 500ML (MISCELLANEOUS) ×3 IMPLANT
YANKAUER SUCT BULB TIP NO VENT (SUCTIONS) ×3 IMPLANT

## 2017-03-30 NOTE — ED Notes (Signed)
Patient transported to CT scan with RN.

## 2017-03-30 NOTE — Anesthesia Procedure Notes (Signed)
Arterial Line Insertion Performed by: MASSAGEE, TERRY, LEE, HEATHER B, CRNA  Preanesthetic checklist: patient identified, IV checked, site marked, risks and benefits discussed, surgical consent, monitors and equipment checked, pre-op evaluation and timeout performed Left, radial was placed Catheter size: 20 G Hand hygiene performed  and maximum sterile barriers used  Allen's test indicative of satisfactory collateral circulation Procedure performed without using ultrasound guided technique. Following insertion, dressing applied and Biopatch. Post procedure assessment: normal  Patient tolerated the procedure well with no immediate complications.

## 2017-03-30 NOTE — Progress Notes (Signed)
Orthopedic Tech Progress Note Patient Details:  Ricardo RummageRichard Friedman 12/05/1970 [id number]  Ortho Devices Type of Ortho Device: Arm sling Ortho Device/Splint Location: RUE Ortho Device/Splint Interventions: Ordered, Application   Jennye MoccasinHughes, [name] 03/30/2017, 6:45 PM
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
## 2017-03-30 NOTE — Transfer of Care (Signed)
Immediate Anesthesia Transfer of Care Note  Patient: Ricardo Friedman  Procedure(s) Performed: Procedure(s): OPEN REDUCTION INTERNAL FIXATION (ORIF) PELVIC FRACTURE/SYMPHYSIS PUBIS TRANSSACRAL SCREW, OPEN FIBULA FRACTURE (Left) SACRO-ILIAC PINNING (Left)  Patient Location: PACU  Anesthesia Type:General  Level of Consciousness: awake, sedated, patient cooperative and responds to stimulation  Airway & Oxygen Therapy: Patient Spontanous Breathing and Patient connected to nasal cannula oxygen  Post-op Assessment: Report given to RN and Post -op Vital signs reviewed and stable  Post vital signs: Reviewed and stable  Last Vitals:  Vitals:   03/30/17 0915 03/30/17 0945  BP: 119/81 (!) 113/102  Pulse: (!) 101 (!) 102  Resp: (!) 23 (!) 22  Temp:      Last Pain:  Vitals:   03/30/17 0954  TempSrc:   PainSc: 5          Complications: No apparent anesthesia complications
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
## 2017-03-30 NOTE — Progress Notes (Signed)
CRITICAL VALUE ALERT  Critical Value: lactic acid - 2.8  Date & Time Notied: 03/30/2017 @ 1928  Provider Notified: On-call Trauma MD - Janee Mornhompson @ 1930  Orders Received/Actions taken: Orders to administer 5% Albumin 12.5g bolus and obtain a CBC - then to notify trauma MD if any of those result critical.   Also given orders at that time for a PCA pump to help alleviate patients on-going/constant pain.  Will complete all orders/tasks and continue to monitor closely.   Francia GreavesSavannah R Granville Whitefield, RN

## 2017-03-30 NOTE — Progress Notes (Signed)
Patient ID: Ricardo RummageRichard Scantling, male   DOB: 04/15/1971, [age] y.o.   MRN: [id number] Patient examined and recovered postoperatively moving left upper cavity well in both lower extremity is well no change in neurologic exam postoperatively.
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
## 2017-03-30 NOTE — Progress Notes (Signed)
Orthopedic Tech Progress Note Patient Details:  Ricardo RummageRichard Friedman 12/04/1970 [id number]  Patient ID: Ricardo Friedman, male   DOB: 10/03/1971, 46 y.o.   MRN: [id number] Assisted with dressing application to open tib fib fx. Dr wanted to leave existing ems splint on leg.  Trinna PostMartinez, [name] 03/30/2017, 6:48 AM

## 2017-03-30 NOTE — ED Notes (Signed)
2ND Unit of emergency release given unit # [id number] 18 [id number] 5, volume 328, exp date 04/07/2017 O-neg. Verified with Jeanice LimHolly, Charity fundraiserN, given via [facility].
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
## 2017-03-30 NOTE — ED Notes (Signed)
Spoke with Dr. Janee Mornhompson on phone made aware BP 89/67, reports to give 2 units of blood via [facility]. Will do emergency release since patient is not type and screened.
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
## 2017-03-30 NOTE — ED Notes (Signed)
OR ready for patient. [facility] to [phone]. Will give report at bedside.

## 2017-03-30 NOTE — ED Notes (Signed)
LSB removed by Dr. Preston FleetingGlick .

## 2017-03-30 NOTE — Procedures (Signed)
Informed consent was obtained before procedure started.  The area was prepped and draped in the usual sterile fashion. Local anesthesia was achieved using 3cc of Lidocaine 2% with epinephrine. The wound was copiously irrigated. 5 3-0 prolene interrupted sutures were placed. Estimated blood loss was less than 0.5 mL. A dressing was applied to the area. The patient tolerated the procedure well without complications or blood loss.  Follow-up visit will be set for suture removal and evaluation of the laceration.  Vanice SarahLauren Alixandria Friedt PA-S
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
## 2017-03-30 NOTE — Anesthesia Postprocedure Evaluation (Signed)
Anesthesia Post Note  Patient: Ricardo Friedman  Procedure(s) Performed: Procedure(s) (LRB): OPEN REDUCTION INTERNAL FIXATION (ORIF) PELVIC FRACTURE/SYMPHYSIS PUBIS TRANSSACRAL SCREW, OPEN FIBULA FRACTURE (Left) SACRO-ILIAC PINNING (Left)     Patient location during evaluation: PACU Anesthesia Type: General Level of consciousness: awake and alert Pain management: pain level controlled Vital Signs Assessment: post-procedure vital signs reviewed and stable Respiratory status: spontaneous breathing, nonlabored ventilation, respiratory function stable and patient connected to nasal cannula oxygen Cardiovascular status: blood pressure returned to baseline and stable Postop Assessment: no signs of nausea or vomiting Anesthetic complications: no    Last Vitals:  Vitals:   03/30/17 1715 03/30/17 1730  BP: 121/81 116/73  Pulse: 92 94  Resp: (!) 22 20  Temp:  37.3 C    Last Pain:  Vitals:   03/30/17 1730  TempSrc:   PainSc: 0-No pain    LLE Motor Response: Purposeful movement;Responds to commands (wiggles toes) (03/30/17 1730) LLE Sensation: Full sensation (03/30/17 1730) RLE Motor Response: Purposeful movement;Responds to commands (wiggles toes) (03/30/17 1730) RLE Sensation: Full sensation (03/30/17 1730)      Cecile HearingStephen Edward Malon Branton

## 2017-03-31 ENCOUNTER — Encounter (HOSPITAL_COMMUNITY): Payer: Self-pay | Admitting: Orthopedic Surgery

## 2017-03-31 DIAGNOSIS — S81802A Unspecified open wound, left lower leg, initial encounter: Secondary | ICD-10-CM

## 2017-03-31 DIAGNOSIS — S3210XA Unspecified fracture of sacrum, initial encounter for closed fracture: Secondary | ICD-10-CM

## 2017-03-31 DIAGNOSIS — S82402A Unspecified fracture of shaft of left fibula, initial encounter for closed fracture: Secondary | ICD-10-CM

## 2017-03-31 DIAGNOSIS — S32811B Multiple fractures of pelvis with unstable disruption of pelvic ring, initial encounter for open fracture: Secondary | ICD-10-CM

## 2017-03-31 DIAGNOSIS — S143XXA Injury of brachial plexus, initial encounter: Secondary | ICD-10-CM

## 2017-03-31 HISTORY — DX: Unspecified fracture of shaft of left fibula, initial encounter for closed fracture: S82.402A

## 2017-03-31 HISTORY — DX: Unspecified fracture of sacrum, initial encounter for closed fracture: S32.10XA

## 2017-03-31 HISTORY — DX: Multiple fractures of pelvis with unstable disruption of pelvic ring, initial encounter for open fracture: S32.811B

## 2017-03-31 HISTORY — DX: Injury of brachial plexus, initial encounter: S14.3XXA

## 2017-03-31 HISTORY — DX: Unspecified open wound, left lower leg, initial encounter: S81.802A

## 2017-03-31 LAB — COMPREHENSIVE METABOLIC PANEL
ALBUMIN: 3.1 g/dL — AB (ref 3.5–5.0)
ALK PHOS: 36 U/L — AB (ref 38–126)
ALT: 132 U/L — ABNORMAL HIGH (ref 17–63)
ANION GAP: 7 (ref 5–15)
AST: 216 U/L — ABNORMAL HIGH (ref 15–41)
BUN: 5 mg/dL — ABNORMAL LOW (ref 6–20)
CALCIUM: 7.9 mg/dL — AB (ref 8.9–10.3)
CO2: 22 mmol/L (ref 22–32)
Chloride: 106 mmol/L (ref 101–111)
Creatinine, Ser: 0.7 mg/dL (ref 0.61–1.24)
GFR calc non Af Amer: >60 mL/min (ref >60)
GLUCOSE: 150 mg/dL — AB (ref 65–99)
POTASSIUM: 3.8 mmol/L (ref 3.5–5.1)
SODIUM: 135 mmol/L (ref 135–145)
TOTAL PROTEIN: 4.9 g/dL — AB (ref 6.5–8.1)
Total Bilirubin: 1.2 mg/dL (ref 0.3–1.2)

## 2017-03-31 LAB — CBC
HCT: 30.9 % — ABNORMAL LOW (ref 39.0–52.0)
Hemoglobin: 10.6 g/dL — ABNORMAL LOW (ref 13.0–17.0)
MCH: 30.6 pg (ref 26.0–34.0)
MCHC: 34.3 g/dL (ref 30.0–36.0)
MCV: 89.3 fL (ref 78.0–100.0)
Platelets: 146 10*3/uL — ABNORMAL LOW (ref 150–400)
RBC: 3.46 MIL/uL — ABNORMAL LOW (ref 4.22–5.81)
RDW: 16.5 % — AB (ref 11.5–15.5)
WBC: 10.1 10*3/uL (ref 4.0–10.5)

## 2017-03-31 LAB — HIV ANTIBODY (ROUTINE TESTING W REFLEX): HIV Screen 4th Generation wRfx: NONREACTIVE

## 2017-03-31 LAB — LACTIC ACID, PLASMA: Lactic Acid, Venous: 2.1 mmol/L (ref 0.5–1.9)

## 2017-03-31 MED ORDER — NICOTINE 21 MG/24HR TD PT24
21.0000 mg | MEDICATED_PATCH | Freq: Every day | TRANSDERMAL | Status: DC
  Administered 2017-03-31: 21 mg via TRANSDERMAL
  Filled 2017-03-31: qty 1

## 2017-03-31 MED ORDER — ENOXAPARIN SODIUM 40 MG/0.4ML ~~LOC~~ SOLN
40.0000 mg | SUBCUTANEOUS | Status: DC
  Administered 2017-03-31 – 2017-04-05 (×6): 40 mg via SUBCUTANEOUS
  Filled 2017-03-31 (×6): qty 0.4

## 2017-03-31 MED ORDER — OXYCODONE HCL 5 MG PO TABS
5.0000 mg | ORAL_TABLET | ORAL | Status: DC | PRN
  Administered 2017-04-01 – 2017-04-05 (×17): 10 mg via ORAL
  Filled 2017-03-31 (×17): qty 2

## 2017-03-31 MED ORDER — ACETAMINOPHEN 500 MG PO TABS
1000.0000 mg | ORAL_TABLET | Freq: Three times a day (TID) | ORAL | Status: DC
  Administered 2017-03-31 – 2017-04-05 (×16): 1000 mg via ORAL
  Filled 2017-03-31 (×16): qty 2

## 2017-03-31 MED ORDER — ORAL CARE MOUTH RINSE
15.0000 mL | Freq: Two times a day (BID) | OROMUCOSAL | Status: DC
  Administered 2017-03-31 – 2017-04-05 (×9): 15 mL via OROMUCOSAL

## 2017-03-31 NOTE — Progress Notes (Signed)
Patient ID: Ricardo RummageRichard Friedman, male   DOB: 06/30/1971, 46 y.o.   MRN: [id number] Patient doing well with no complaints left upper summary and both lower extremities feel normal with no numbness or tingling. Right upper cavity unchanged awaiting MRIs
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
## 2017-03-31 NOTE — Progress Notes (Signed)
OT Cancellation Note  Patient Details Name: Annamary RummageRichard Gonce MRN: [id number] DOB: 08/05/1971   Cancelled Treatment:    Reason Eval/Treat Not Completed: Medical issues which prohibited therapy ( strict Bedrest orders) OT to follow closley noting the possible C6 brachial plexus injury noted by Dr Riki Sheerram  Lary Eckardt B  [phone] 03/31/2017, 7:13 AM

## 2017-03-31 NOTE — Progress Notes (Signed)
Orthopedic Tech Progress Note Patient Details:  Annamary RummageRichard Templer 06/19/1971 [id number]  Patient ID: Annamary Rummageichard Latorre, male   DOB: 05/09/1971, 46 y.o.   MRN: [id number]   [name], Zakhi Dupre 03/31/2017, 9:08 AM Called in bio-tech brace order; spoke with Wylene MenLacey

## 2017-03-31 NOTE — Evaluation (Signed)
Physical Therapy Evaluation Patient Details Name: Ricardo RummageRichard Friedman MRN: [id number] DOB: 02/04/1971 Today's Date: 03/31/2017   History of Present Illness  pt is a 46 y/o male with no significant PMH, admitted on 03/30/17 s/p [facility] with C6 fx, R UE brachial plexus injury, R forehead lac, complex 3 ring pelvic fx, open L fibular fx.  Pt s/p ORIF pelvic fracture/symphysis pubis, SI pinning, fibular pinning post VAC dressing.  Clinical Impression  [facility]'s, complications and surgeries stated above.  Pt is 2 person max to total assist at this time, limited mostly by pain and NWB status..  Pt currently limited functionally due to the problems listed. ( See problems list.)   Pt will benefit from PT to maximize function and safety in order to get ready for next venue listed below.     Follow Up Recommendations CIR    Equipment Recommendations  Wheelchair (measurements PT);Wheelchair cushion (measurements PT)    Recommendations for Other Services Rehab consult     Precautions / Restrictions Precautions Precautions: Fall Required Braces or Orthoses: Sling;Cervical Brace (R UE for comfort) Restrictions Weight Bearing Restrictions: Yes RLE Weight Bearing: Non weight bearing      Mobility  Bed Mobility Overal bed mobility: Needs Assistance Bed Mobility: Supine to Sit;Sit to Supine     Supine to sit: Max assist;+2 for physical assistance;+2 for safety/equipment Sit to supine: Total assist;+2 for physical assistance   General bed mobility comments: pt assisted with L UE and L LE to extent of pain.   Used pad and "helicopter" technique to get pt to EOB.  Due to SI/pelvic pain and L shin pain, pt unable to sit with L LE to floor or attempt to sit unassisted.  Transfers                 General transfer comment: NT due to pain  Ambulation/Gait                Stairs            Wheelchair Mobility    Modified Rankin (Stroke Patients Only)        Balance Overall balance assessment: Needs assistance Sitting-balance support: Single extremity supported Sitting balance-Leahy Scale: Poor Sitting balance - Comments: due to pain unable to sit on his own without external asisst                                     Pertinent Vitals/Pain Pain Assessment: Faces Faces Pain Scale: Hurts whole lot Pain Location: groin, L shin Pain Descriptors / Indicators: Aching;Sore;Sharp Pain Intervention(s): Monitored during session;Limited activity within patient's tolerance;Premedicated before session    Home Living Family/patient expects to be discharged to:: Private residence Living Arrangements: Spouse/significant other;Children   Type of Home: Apartment Home Access: Stairs to enter   Secretary/administratorntrance Stairs-Number of Steps: 1 Home Layout: One level Home Equipment: None Additional Comments: pt likely going to his girlfriend's home to stay until no care needs.  pt's girlfriend works and her family has committed to help care for him while she works.    Prior Function Level of Independence: Independent         Comments: Worked, drove, independent     Higher education careers adviserHand Dominance        Extremity/Trunk Assessment   Upper Extremity Assessment Upper Extremity Assessment: RUE deficits/detail;LUE deficits/detail RUE Deficits / Details: no spontaneous movement in any plane. LUE Deficits /  Details: WFL    Lower Extremity Assessment Lower Extremity Assessment: RLE deficits/detail;LLE deficits/detail RLE Deficits / Details: moves minimally against gravity, limited by pain.  Grossly 3/5 RLE: Unable to fully assess due to pain RLE Coordination: decreased fine motor LLE Deficits / Details: moves against gravity. grossly 4/5, but painful mostly at open fibular wound. LLE: Unable to fully assess due to pain LLE Coordination: decreased fine motor       Communication   Communication: No difficulties  Cognition Arousal/Alertness:  Awake/alert Behavior During Therapy: WFL for tasks assessed/performed Overall Cognitive Status: Within Functional Limits for tasks assessed                                        General Comments General comments (skin integrity, edema, etc.): Vital stable throughout.    Exercises     Assessment/Plan    PT Assessment Patient needs continued PT services  PT Problem List Decreased strength;Decreased activity tolerance;Decreased balance;Decreased mobility;Decreased coordination;Pain       PT Treatment Interventions Functional mobility training;Therapeutic activities;Therapeutic exercise;Balance training;Patient/family education;DME instruction    PT Goals (Current goals can be found in the Care Plan section)  Acute Rehab PT Goals Patient Stated Goal: Ultimately Independent, work. PT Goal Formulation: With patient Time For Goal Achievement: 04/14/17 Potential to Achieve Goals: Good    Frequency Min 4X/week   Barriers to discharge        Co-evaluation               AM-PAC PT "6 Clicks" Daily Activity  Outcome Measure Difficulty turning over in bed (including adjusting bedclothes, sheets and blankets)?: Total Difficulty moving from lying on back to sitting on the side of the bed? : Total Difficulty sitting down on and standing up from a chair with arms (e.g., wheelchair, bedside commode, etc,.)?: Total Help needed moving to and from a bed to chair (including a wheelchair)?: A Lot Help needed walking in hospital room?: Total Help needed climbing 3-5 steps with a railing? : Total 6 Click Score: 7    End of Session Equipment Utilized During Treatment: Cervical collar Activity Tolerance: Patient tolerated treatment well;Patient limited by pain Patient left: in bed;with call bell/phone within reach;with bed alarm set;with family/visitor present Nurse Communication: Mobility status PT Visit Diagnosis: Other abnormalities of gait and mobility  (R26.89);Pain;Other symptoms and signs involving the nervous system (R29.898) Pain - Right/Left: Right Pain - part of body: Arm;Leg;Hip    Time: 6962-9528 PT Time Calculation (min) (ACUTE ONLY): 40 min   Charges:   PT Evaluation $PT Eval Moderate Complexity: 1 Procedure PT Treatments $Therapeutic Activity: 23-37 mins   PT G Codes:        Apr 04, 2017  Racine Bing, PT [phone]  (pager)  Eliseo Gum Ricardo Friedman 2017-04-04, 5:32 PM

## 2017-03-31 NOTE — Care Management Note (Signed)
Case Management Note  Patient Details  Name: Ricardo RummageRichard Friedman MRN: [id number] Date of Birth: 05/12/1971  Subjective/Objective:    [facility], likely RUE brachial plexus injury, Rt forehead lac, complex pelvic fx, open Lt fibula fx.  PTA, pt independent, lives with spouse.                  Action/Plan: Will follow for discharge planning as pt progresses.  Therapies when able to tolerate.   Expected Discharge Date:                  Expected Discharge Plan:  IP Rehab Facility  In-House Referral:  Clinical Social Work  Discharge planning Services  CM Consult  Post Acute Care Choice:    Choice offered to:     DME Arranged:    DME Agency:     HH Arranged:    HH Agency:     Status of Service:  In process, will continue to follow  If discussed at Long Length of Stay Meetings, dates discussed:    Additional Comments:  Quintella BatonJulie W. Khara Renaud, RN, BSN  Trauma/Neuro ICU Case Manager [phone]

## 2017-03-31 NOTE — Progress Notes (Signed)
CRITICAL VALUE ALERT  Critical Value: lactic acid - 2.1  Date & Time Notied: 03/31/2017 @ 0615  Provider Notified: on-call trauma MD - Janee Mornhompson  Orders Received/Actions taken: no new orders received; MD stated they will take a look at labs upon rounding this AM.   Will continue to monitor.   Francia GreavesSavannah R Trisha Ken, RN

## 2017-04-01 ENCOUNTER — Inpatient Hospital Stay (HOSPITAL_COMMUNITY): Payer: Commercial Managed Care - PPO

## 2017-04-01 LAB — CBC WITH DIFFERENTIAL/PLATELET
BASOS ABS: 0 10*3/uL (ref 0.0–0.1)
Basophils Relative: 0 %
Eosinophils Absolute: 0.1 10*3/uL (ref 0.0–0.7)
Eosinophils Relative: 1 %
HEMATOCRIT: 31.1 % — AB (ref 39.0–52.0)
Hemoglobin: 10.5 g/dL — ABNORMAL LOW (ref 13.0–17.0)
LYMPHS PCT: 16 %
Lymphs Abs: 1.5 10*3/uL (ref 0.7–4.0)
MCH: 30.8 pg (ref 26.0–34.0)
MCHC: 33.8 g/dL (ref 30.0–36.0)
MCV: 91.2 fL (ref 78.0–100.0)
MONO ABS: 0.8 10*3/uL (ref 0.1–1.0)
Monocytes Relative: 9 %
NEUTROS ABS: 7.1 10*3/uL (ref 1.7–7.7)
Neutrophils Relative %: 74 %
Platelets: 145 10*3/uL — ABNORMAL LOW (ref 150–400)
RBC: 3.41 MIL/uL — AB (ref 4.22–5.81)
RDW: 15.7 % — AB (ref 11.5–15.5)
WBC: 9.4 10*3/uL (ref 4.0–10.5)

## 2017-04-01 LAB — BASIC METABOLIC PANEL
Anion gap: 7 (ref 5–15)
BUN: 5 mg/dL — ABNORMAL LOW (ref 6–20)
CALCIUM: 8.2 mg/dL — AB (ref 8.9–10.3)
CO2: 24 mmol/L (ref 22–32)
CREATININE: 0.67 mg/dL (ref 0.61–1.24)
Chloride: 104 mmol/L (ref 101–111)
Glucose, Bld: 225 mg/dL — ABNORMAL HIGH (ref 65–99)
Potassium: 4 mmol/L (ref 3.5–5.1)
SODIUM: 135 mmol/L (ref 135–145)

## 2017-04-01 MED ORDER — HYDROMORPHONE HCL 1 MG/ML IJ SOLN
0.5000 mg | INTRAMUSCULAR | Status: DC | PRN
Start: 2017-04-01 — End: 2017-04-02
  Administered 2017-04-01 – 2017-04-02 (×4): 1 mg via INTRAVENOUS
  Filled 2017-04-01 (×4): qty 1

## 2017-04-01 MED ORDER — GADOBENATE DIMEGLUMINE 529 MG/ML IV SOLN
20.0000 mL | Freq: Once | INTRAVENOUS | Status: AC | PRN
  Administered 2017-04-01: 20 mL via INTRAVENOUS

## 2017-04-01 NOTE — Progress Notes (Addendum)
2 Days Post-Op   Subjective/Chief Complaint: Discussed with nsurg mri results, having flatus, no bm, pain controlled   Objective: Vital signs in last 24 hours: Temp:  [98.8 F (37.1 C)-100.2 F (37.9 C)] 99.8 F (37.7 C) (06/30 0800) Pulse Rate:  [84-103] 96 (06/30 1003) Resp:  [16-24] 20 (06/30 1003) BP: (90-118)/(62-80) 114/62 (06/30 1003) SpO2:  [93 %-100 %] 95 % (06/30 1003) Last BM Date:  (PTA)  Intake/Output from previous day: 06/29 0701 - 06/30 0700 In: 2443.8 [P.O.:1080; I.V.:1213.8; IV Piggyback:150] Out: 8295 [AOZHY:8657; Drains:30] Intake/Output this shift: Total I/O In: 200 [I.V.:200] Out: 40 [Drains:40]  General appearance: no distress Resp: clear to auscultation bilaterally Cardio: regular rate and rhythm GI: soft nt  Lab Results:   Recent Labs  03/31/17 0452 04/01/17 0550  WBC 10.1 9.4  HGB 10.6* 10.5*  HCT 30.9* 31.1*  PLT 146* 145*   BMET  Recent Labs  03/30/17 2032 03/31/17 0452  NA 135 135  K 3.8 3.8  CL 108 106  CO2 21* 22  GLUCOSE 168* 150*  BUN 8 5*  CREATININE 0.83 0.70  CALCIUM 7.8* 7.9*   PT/INR  Recent Labs  03/30/17 0608  LABPROT 14.0  INR 1.08   ABG No results for input(s): PHART, HCO3 in the last 72 hours.  Invalid input(s): PCO2, PO2  Studies/Results: Dg Si Joints  Result Date: 03/30/2017 CLINICAL DATA:  Fixation of pelvic fractures and SI joint diastases. EXAM: BILATERAL SACROILIAC JOINTS - 3+ VIEW COMPARISON:  Prior studies FINDINGS: Five intraoperative spot views of the pelvis are submitted postoperatively for interpretation. Two screws are noted traversing both SI joints. Internal plate and screw fixation along the symphysis pubis noted. No definite complicating features. IMPRESSION: Fixation of the SI joints and symphysis pubis. No definite complicating features. Electronically Signed   By: Harmon Pier M.D.   On: 03/30/2017 16:54   Mr Chest W Wo Contrast  Result Date: 04/01/2017 EXAM: MRI CERVICAL SPINE  WITHOUT AND WITH CONTRAST MRI CHEST WITH AND WITHOUT CONTRAST TECHNIQUE: Multiplanar and multiecho pulse sequences of the cervical spine, to include the craniocervical junction and cervicothoracic junction, were obtained without and with intravenous contrast. Multiplanar multi-echo pulse sequences of the chest, brachia plexus protocol, were obtained with and without intravenous contrast. CONTRAST:  20mL MULTIHANCE GADOBENATE DIMEGLUMINE 529 MG/ML IV SOLN COMPARISON:  03/30/2017 cervical spine CT. FINDINGS: MRI CERVICAL SPINE: Alignment: Straightening of cervical lordosis.  No listhesis. Vertebrae: Edema within the anterior inferior endplate of C6 corresponding to corner fracture on CT. Edema within the C6 endplate, posterior longitudinal ligament discontinuity at C6-7, and interspinous edema at C5-6 and C6-7 compatible with ligamentous injury. Widening of the right-sided C6-7 facet with small facet effusion compatible with capsular destruction of the facet joint. No additional evidence for acute osseous or ligamentous injury of the cervical spine. Cord: No abnormal cord signal. Numerous are not identified within the C7-T1 and T1-2 neural foramen probably C8 and T1 nerve root avulsion (series 3, image 1). Posterior Fossa, vertebral arteries, paraspinal tissues: Mild prevertebral edema from C2 through T1. Extensive edema and enhancement within right longus coli, right scalene muscles, and right greater than left paraspinal muscles compatible with muscle injury Disc levels: C2-3: No significant disc displacement, foraminal narrowing, or canal stenosis. C3-4: Disc osteophyte complex and uncovertebral/ facet hypertrophy eccentric to the right with mild facet hypertrophy. Moderate bilateral foraminal narrowing and moderate canal stenosis with mild right anterior cord impingement. C4-5: Disc osteophyte complex with uncovertebral and facet hypertrophy. Moderate bilateral  foraminal narrowing and moderate canal stenosis with  mild anterior cord impingement. C5-6: Disc osteophyte complex with uncovertebral and facet hypertrophy eccentric to the left. Mild right and moderate left foraminal narrowing. Moderate canal stenosis with mild left anterior cord impingement. C6-7: Disc osteophyte complex with uncovertebral and facet hypertrophy. Mild bilateral foraminal narrowing and canal stenosis. C7-T1: No significant disc displacement, foraminal narrowing, or canal stenosis. MRI CHEST: Bones:  As above. Soft tissues: Edema within right greater than left paraspinal muscles, right longus coli muscle, and right scalene muscles compatible with muscle injury. Edema throughout the right supraclavicular fossa extending into the right chest wall along the course of the right brachia plexus. Brachia plexus: The right C8 and T1 nerve roots are avulsed and there is edema with distortion of the brachia plexus within the supraclavicular and retroclavicular segments of the right brachia plexus compatible with plexopathy and injury. The brachia plexus is identified in the infraclavicular segment with edema along its course into the axilla. Other:  Negative. IMPRESSION: 1. Right C8 and right T1 nerve roots are not appreciated within the right neural foramen, likely representing nerve root avulsion. 2. Edema and distortion of the supraclavicular and infraclavicular segments of the brachia plexus compatible with brachial plexus injury. Edema extends into the infraclavicular plexus of cords and branches which appear intact. 3. Edema within the C6 endplate, posterior longitudinal ligament discontinuity at C6-7, and interspinous edema at C5-6 and C6-7 compatible with ligamentous injury. 4. Widening of the right-sided C6-7 facet with small facet effusion compatible with capsular injury of the facet joint. 5. Extensive edema throughout the right longus coli, right scalene muscles, and right greater than left paraspinal muscles compatible with soft tissue injury. 6. No  abnormal cord signal. 7. Cervical spondylosis with moderate C3-4 through C5-6 canal stenosis with mild anterior cord impingement. Electronically Signed   By: Mitzi Hansen M.D.   On: 04/01/2017 05:20   Mr Cervical Spine W Wo Contrast  Result Date: 04/01/2017 EXAM: MRI CERVICAL SPINE WITHOUT AND WITH CONTRAST MRI CHEST WITH AND WITHOUT CONTRAST TECHNIQUE: Multiplanar and multiecho pulse sequences of the cervical spine, to include the craniocervical junction and cervicothoracic junction, were obtained without and with intravenous contrast. Multiplanar multi-echo pulse sequences of the chest, brachia plexus protocol, were obtained with and without intravenous contrast. CONTRAST:  20mL MULTIHANCE GADOBENATE DIMEGLUMINE 529 MG/ML IV SOLN COMPARISON:  03/30/2017 cervical spine CT. FINDINGS: MRI CERVICAL SPINE: Alignment: Straightening of cervical lordosis.  No listhesis. Vertebrae: Edema within the anterior inferior endplate of C6 corresponding to corner fracture on CT. Edema within the C6 endplate, posterior longitudinal ligament discontinuity at C6-7, and interspinous edema at C5-6 and C6-7 compatible with ligamentous injury. Widening of the right-sided C6-7 facet with small facet effusion compatible with capsular destruction of the facet joint. No additional evidence for acute osseous or ligamentous injury of the cervical spine. Cord: No abnormal cord signal. Numerous are not identified within the C7-T1 and T1-2 neural foramen probably C8 and T1 nerve root avulsion (series 3, image 1). Posterior Fossa, vertebral arteries, paraspinal tissues: Mild prevertebral edema from C2 through T1. Extensive edema and enhancement within right longus coli, right scalene muscles, and right greater than left paraspinal muscles compatible with muscle injury Disc levels: C2-3: No significant disc displacement, foraminal narrowing, or canal stenosis. C3-4: Disc osteophyte complex and uncovertebral/ facet hypertrophy  eccentric to the right with mild facet hypertrophy. Moderate bilateral foraminal narrowing and moderate canal stenosis with mild right anterior cord impingement. C4-5: Disc osteophyte complex with  uncovertebral and facet hypertrophy. Moderate bilateral foraminal narrowing and moderate canal stenosis with mild anterior cord impingement. C5-6: Disc osteophyte complex with uncovertebral and facet hypertrophy eccentric to the left. Mild right and moderate left foraminal narrowing. Moderate canal stenosis with mild left anterior cord impingement. C6-7: Disc osteophyte complex with uncovertebral and facet hypertrophy. Mild bilateral foraminal narrowing and canal stenosis. C7-T1: No significant disc displacement, foraminal narrowing, or canal stenosis. MRI CHEST: Bones:  As above. Soft tissues: Edema within right greater than left paraspinal muscles, right longus coli muscle, and right scalene muscles compatible with muscle injury. Edema throughout the right supraclavicular fossa extending into the right chest wall along the course of the right brachia plexus. Brachia plexus: The right C8 and T1 nerve roots are avulsed and there is edema with distortion of the brachia plexus within the supraclavicular and retroclavicular segments of the right brachia plexus compatible with plexopathy and injury. The brachia plexus is identified in the infraclavicular segment with edema along its course into the axilla. Other:  Negative. IMPRESSION: 1. Right C8 and right T1 nerve roots are not appreciated within the right neural foramen, likely representing nerve root avulsion. 2. Edema and distortion of the supraclavicular and infraclavicular segments of the brachia plexus compatible with brachial plexus injury. Edema extends into the infraclavicular plexus of cords and branches which appear intact. 3. Edema within the C6 endplate, posterior longitudinal ligament discontinuity at C6-7, and interspinous edema at C5-6 and C6-7 compatible  with ligamentous injury. 4. Widening of the right-sided C6-7 facet with small facet effusion compatible with capsular injury of the facet joint. 5. Extensive edema throughout the right longus coli, right scalene muscles, and right greater than left paraspinal muscles compatible with soft tissue injury. 6. No abnormal cord signal. 7. Cervical spondylosis with moderate C3-4 through C5-6 canal stenosis with mild anterior cord impingement. Electronically Signed   By: Mitzi HansenLance  Furusawa-Stratton M.D.   On: 04/01/2017 05:20   Dg Pelvis Comp Min 3v  Result Date: 03/30/2017 CLINICAL DATA:  Internal fixation of SI joints and pubic symphysis diastases EXAM: JUDET PELVIS - 3+ VIEW COMPARISON:  Prior studies FINDINGS: Two screws traversing both SI joints are noted. Internal plate and screw fixations traversing the symphysis pubis noted. No complicating features noted. IMPRESSION: Internal fixation of the SI joints and symphysis pubis. Electronically Signed   By: Harmon PierJeffrey  Hu M.D.   On: 03/30/2017 20:14   Dg Pelvis 3v Judet  Result Date: 03/30/2017 CLINICAL DATA:  ORIF of pubic symphysis instability EXAM: JUDET PELVIS - 3+ VIEW; DG C-ARM GT 120 MIN COMPARISON:  03/30/2017 FLUOROSCOPY TIME:  Fluoroscopy Time:  16 seconds Radiation Exposure Index (if provided by the fluoroscopic device): Not available Number of Acquired Spot Images: 3 FINDINGS: Fixation sideplate is noted traversing the superior pubic rami bilaterally. Multiple fixation screws are noted. The pubic symphysis has been approximated. IMPRESSION: Status post pubic symphysis fixation Electronically Signed   By: Alcide CleverMark  Lukens M.D.   On: 03/30/2017 16:33   Dg C-arm Gt 120 Min  Result Date: 03/30/2017 CLINICAL DATA:  ORIF of pubic symphysis instability EXAM: JUDET PELVIS - 3+ VIEW; DG C-ARM GT 120 MIN COMPARISON:  03/30/2017 FLUOROSCOPY TIME:  Fluoroscopy Time:  16 seconds Radiation Exposure Index (if provided by the fluoroscopic device): Not available Number of  Acquired Spot Images: 3 FINDINGS: Fixation sideplate is noted traversing the superior pubic rami bilaterally. Multiple fixation screws are noted. The pubic symphysis has been approximated. IMPRESSION: Status post pubic symphysis fixation Electronically Signed  By: Alcide Clever M.D.   On: 03/30/2017 16:33    Anti-infectives: Anti-infectives    Start     Dose/Rate Route Frequency Ordered Stop   03/30/17 2200  ceFAZolin (ANCEF) IVPB 1 g/50 mL premix     1 g 100 mL/hr over 30 Minutes Intravenous Every 8 hours 03/30/17 1749 04/02/17 2159   03/30/17 1030  ceFAZolin (ANCEF) IVPB 2g/100 mL premix     2 g 200 mL/hr over 30 Minutes Intravenous On call to O.R. 03/30/17 1017 03/30/17 1210   03/30/17 1019  ceFAZolin (ANCEF) 2-4 GM/100ML-% IVPB    Comments:  Lorenda Ishihara   : cabinet override      03/30/17 1019 03/30/17 1210   03/30/17 0615  ceFAZolin (ANCEF) IVPB 1 g/50 mL premix     1 g 100 mL/hr over 30 Minutes Intravenous  Once 03/30/17 0614 03/30/17 0706      Assessment/Plan: [facility] C6 FX with widened facet - MR done with widening of c6-7 facet, recommended c collar until follow up Right brachial plexus injury with MRI showing c8 and t1 nerve root avulsion R forehead lac - repair in ED Complex pelvic FX with open book/sacral FX/R SI diastasis - POD 2 from orif ant ring and sacral screws, per ortho, scrotal swelling should decrease Open L fibula FX - wound vac per handy tx to floor, pt Pain control- will transition from pca to oral meds today Regular diet Scds, lovenox Check bmet due to large volume urine output Linnae Rasool 04/01/2017
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
## 2017-04-02 LAB — POCT I-STAT 7, (LYTES, BLD GAS, ICA,H+H)
ACID-BASE DEFICIT: 7 mmol/L — AB (ref 0.0–2.0)
ACID-BASE DEFICIT: 6 mmol/L — AB (ref 0.0–2.0)
Acid-base deficit: 7 mmol/L — ABNORMAL HIGH (ref 0.0–2.0)
BICARBONATE: 19.2 mmol/L — AB (ref 20.0–28.0)
BICARBONATE: 18.3 mmol/L — AB (ref 20.0–28.0)
Bicarbonate: 21.2 mmol/L (ref 20.0–28.0)
CALCIUM ION: 1.09 mmol/L — AB (ref 1.15–1.40)
Calcium, Ion: 1.15 mmol/L (ref 1.15–1.40)
Calcium, Ion: 1.05 mmol/L — ABNORMAL LOW (ref 1.15–1.40)
HCT: 25 % — ABNORMAL LOW (ref 39.0–52.0)
HCT: 29 % — ABNORMAL LOW (ref 39.0–52.0)
HEMATOCRIT: 31 % — AB (ref 39.0–52.0)
HEMOGLOBIN: 10.5 g/dL — AB (ref 13.0–17.0)
Hemoglobin: 8.5 g/dL — ABNORMAL LOW (ref 13.0–17.0)
Hemoglobin: 9.9 g/dL — ABNORMAL LOW (ref 13.0–17.0)
O2 SAT: 96 %
O2 SAT: 97 %
O2 Saturation: 98 %
PCO2 ART: 36.7 mmHg (ref 32.0–48.0)
PH ART: 7.19 — AB (ref 7.350–7.450)
PO2 ART: 98 mmHg (ref 83.0–108.0)
PO2 ART: 119 mmHg — AB (ref 83.0–108.0)
POTASSIUM: 4.4 mmol/L (ref 3.5–5.1)
Patient temperature: 37.2
Potassium: 4.5 mmol/L (ref 3.5–5.1)
Potassium: 4.8 mmol/L (ref 3.5–5.1)
Sodium: 140 mmol/L (ref 135–145)
Sodium: 140 mmol/L (ref 135–145)
Sodium: 140 mmol/L (ref 135–145)
TCO2: 23 mmol/L (ref 0–100)
TCO2: 20 mmol/L (ref 0–100)
TCO2: 19 mmol/L (ref 0–100)
pCO2 arterial: 55.7 mmHg — ABNORMAL HIGH (ref 32.0–48.0)
pCO2 arterial: 37.8 mmHg (ref 32.0–48.0)
pH, Arterial: 7.314 — ABNORMAL LOW (ref 7.350–7.450)
pH, Arterial: 7.309 — ABNORMAL LOW (ref 7.350–7.450)
pO2, Arterial: 100 mmHg (ref 83.0–108.0)

## 2017-04-02 MED ORDER — SENNOSIDES-DOCUSATE SODIUM 8.6-50 MG PO TABS
1.0000 | ORAL_TABLET | Freq: Two times a day (BID) | ORAL | Status: DC
  Administered 2017-04-02 – 2017-04-05 (×7): 1 via ORAL
  Filled 2017-04-02 (×7): qty 1

## 2017-04-02 MED ORDER — POLYETHYLENE GLYCOL 3350 17 G PO PACK
17.0000 g | PACK | Freq: Every day | ORAL | Status: DC | PRN
  Administered 2017-04-04 – 2017-04-05 (×2): 17 g via ORAL
  Filled 2017-04-02 (×3): qty 1

## 2017-04-02 MED ORDER — HYDROMORPHONE HCL 1 MG/ML IJ SOLN
0.5000 mg | INTRAMUSCULAR | Status: DC | PRN
  Administered 2017-04-03 – 2017-04-04 (×4): 1 mg via INTRAVENOUS
  Filled 2017-04-02 (×6): qty 1

## 2017-04-02 MED ORDER — TRAMADOL HCL 50 MG PO TABS
50.0000 mg | ORAL_TABLET | Freq: Three times a day (TID) | ORAL | Status: DC
  Administered 2017-04-02: 50 mg via ORAL
  Filled 2017-04-02: qty 1

## 2017-04-02 MED ORDER — TRAMADOL HCL 50 MG PO TABS
50.0000 mg | ORAL_TABLET | Freq: Three times a day (TID) | ORAL | Status: DC
  Administered 2017-04-02 – 2017-04-05 (×8): 50 mg via ORAL
  Filled 2017-04-02 (×8): qty 1

## 2017-04-02 NOTE — Progress Notes (Signed)
Rehab Admissions Coordinator Note:  Patient was screened by Trish MageLogue, Yousof Alderman M for appropriateness for an Inpatient Acute Rehab Consult.  At this time, we are recommending Inpatient Rehab consult.  Lelon FrohlichLogue, Tashya Alberty M 04/02/2017, 9:09 AM  I can be reached at [phone].
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
## 2017-04-03 ENCOUNTER — Encounter (HOSPITAL_COMMUNITY): Payer: Self-pay | Admitting: Orthopedic Surgery

## 2017-04-03 DIAGNOSIS — S32599A Other specified fracture of unspecified pubis, initial encounter for closed fracture: Secondary | ICD-10-CM

## 2017-04-03 DIAGNOSIS — S32811B Multiple fractures of pelvis with unstable disruption of pelvic ring, initial encounter for open fracture: Secondary | ICD-10-CM

## 2017-04-03 DIAGNOSIS — F1010 Alcohol abuse, uncomplicated: Secondary | ICD-10-CM

## 2017-04-03 DIAGNOSIS — S143XXD Injury of brachial plexus, subsequent encounter: Secondary | ICD-10-CM

## 2017-04-03 DIAGNOSIS — S81802D Unspecified open wound, left lower leg, subsequent encounter: Secondary | ICD-10-CM

## 2017-04-03 DIAGNOSIS — D62 Acute posthemorrhagic anemia: Secondary | ICD-10-CM

## 2017-04-03 DIAGNOSIS — Z720 Tobacco use: Secondary | ICD-10-CM

## 2017-04-03 DIAGNOSIS — R739 Hyperglycemia, unspecified: Secondary | ICD-10-CM

## 2017-04-03 DIAGNOSIS — G8918 Other acute postprocedural pain: Secondary | ICD-10-CM

## 2017-04-03 LAB — BPAM RBC
BLOOD PRODUCT EXPIRATION DATE: 201807072359
BLOOD PRODUCT EXPIRATION DATE: 201807202359
Blood Product Expiration Date: 201807032359
Blood Product Expiration Date: 201807062359
Blood Product Expiration Date: 201807072359
Blood Product Expiration Date: 201807202359
ISSUE DATE / TIME: 201806280631
ISSUE DATE / TIME: 201806280631
ISSUE DATE / TIME: 201806280832
ISSUE DATE / TIME: 201806280832
ISSUE DATE / TIME: 201806281316
ISSUE DATE / TIME: 201806281316
Unit Type and Rh: 5100
Unit Type and Rh: 5100
Unit Type and Rh: 9500
Unit Type and Rh: 9500
Unit Type and Rh: 9500
Unit Type and Rh: 9500

## 2017-04-03 LAB — TYPE AND SCREEN
ABO/RH(D): O NEG
ANTIBODY SCREEN: NEGATIVE
UNIT DIVISION: 0
UNIT DIVISION: 0
UNIT DIVISION: 0
UNIT DIVISION: 0
UNIT DIVISION: 0
Unit division: 0

## 2017-04-03 LAB — CBC
HCT: 34.4 % — ABNORMAL LOW (ref 39.0–52.0)
Hemoglobin: 11.7 g/dL — ABNORMAL LOW (ref 13.0–17.0)
MCH: 30.5 pg (ref 26.0–34.0)
MCHC: 34 g/dL (ref 30.0–36.0)
MCV: 89.8 fL (ref 78.0–100.0)
PLATELETS: 214 10*3/uL (ref 150–400)
RBC: 3.83 MIL/uL — ABNORMAL LOW (ref 4.22–5.81)
RDW: 14.2 % (ref 11.5–15.5)
WBC: 7.8 10*3/uL (ref 4.0–10.5)

## 2017-04-03 LAB — BASIC METABOLIC PANEL
ANION GAP: 6 (ref 5–15)
BUN: 6 mg/dL (ref 6–20)
CALCIUM: 8.5 mg/dL — AB (ref 8.9–10.3)
CO2: 26 mmol/L (ref 22–32)
CREATININE: 0.6 mg/dL — AB (ref 0.61–1.24)
Chloride: 102 mmol/L (ref 101–111)
Glucose, Bld: 105 mg/dL — ABNORMAL HIGH (ref 65–99)
Potassium: 3.6 mmol/L (ref 3.5–5.1)
SODIUM: 134 mmol/L — AB (ref 135–145)

## 2017-04-03 MED ORDER — TAMSULOSIN HCL 0.4 MG PO CAPS
0.4000 mg | ORAL_CAPSULE | Freq: Every day | ORAL | Status: DC
  Administered 2017-04-03 – 2017-04-05 (×3): 0.4 mg via ORAL
  Filled 2017-04-03 (×3): qty 1

## 2017-04-03 MED ORDER — WARFARIN - PHARMACIST DOSING INPATIENT
Freq: Every day | Status: DC
  Administered 2017-04-03: 18:00:00

## 2017-04-03 MED ORDER — WARFARIN SODIUM 7.5 MG PO TABS
7.5000 mg | ORAL_TABLET | Freq: Once | ORAL | Status: AC
  Administered 2017-04-03: 7.5 mg via ORAL
  Filled 2017-04-03: qty 1

## 2017-04-03 MED ORDER — BETHANECHOL CHLORIDE 10 MG PO TABS
10.0000 mg | ORAL_TABLET | Freq: Three times a day (TID) | ORAL | Status: DC
  Administered 2017-04-03 – 2017-04-05 (×6): 10 mg via ORAL
  Filled 2017-04-03 (×8): qty 1
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
## 2017-04-03 NOTE — Discharge Instructions (Signed)
Information on my medicine - Coumadin®   (Warfarin) ° °Why was Coumadin prescribed for you? °Coumadin was prescribed for you because you have a blood clot or a medical condition that can cause an increased risk of forming blood clots. Blood clots can cause serious health problems by blocking the flow of blood to the heart, lung, or brain. Coumadin can prevent harmful blood clots from forming. °As a reminder your indication for Coumadin is:   Blood Clot Prevention After Orthopedic Surgery ° °What test will check on my response to Coumadin? °While on Coumadin (warfarin) you will need to have an INR test regularly to ensure that your dose is keeping you in the desired range. The INR (international normalized ratio) number is calculated from the result of the laboratory test called prothrombin time (PT). ° °If an INR APPOINTMENT HAS NOT ALREADY BEEN MADE FOR YOU please schedule an appointment to have this lab work done by your health care provider within 7 days. °Your INR goal is usually a number between:  2 to 3 or your provider may give you a more narrow range like 2-2.5.  Ask your health care provider during an office visit what your goal INR is. ° °What  do you need to  know  About  COUMADIN? °Take Coumadin (warfarin) exactly as prescribed by your healthcare provider about the same time each day.  DO NOT stop taking without talking to the doctor who prescribed the medication.  Stopping without other blood clot prevention medication to take the place of Coumadin may increase your risk of developing a new clot or stroke.  Get refills before you run out. ° °What do you do if you miss a dose? °If you miss a dose, take it as soon as you remember on the same day then continue your regularly scheduled regimen the next day.  Do not take two doses of Coumadin at the same time. ° °Important Safety Information °A possible side effect of Coumadin (Warfarin) is an increased risk of bleeding. You should call your healthcare  provider right away if you experience any of the following: °? Bleeding from an injury or your nose that does not stop. °? Unusual colored urine (red or dark brown) or unusual colored stools (red or black). °? Unusual bruising for unknown reasons. °? A serious fall or if you hit your head (even if there is no bleeding). ° °Some foods or medicines interact with Coumadin® (warfarin) and might alter your response to warfarin. To help avoid this: °? Eat a balanced diet, maintaining a consistent amount of Vitamin K. °? Notify your provider about major diet changes you plan to make. °? Avoid alcohol or limit your intake to 1 drink for women and 2 drinks for men per day. °(1 drink is 5 oz. wine, 12 oz. beer, or 1.5 oz. liquor.) ° °Make sure that ANY health care provider who prescribes medication for you knows that you are taking Coumadin (warfarin).  Also make sure the healthcare provider who is monitoring your Coumadin knows when you have started a new medication including herbals and non-prescription products. ° °Coumadin® (Warfarin)  Major Drug Interactions  °Increased Warfarin Effect Decreased Warfarin Effect  °Alcohol (large quantities) °Antibiotics (esp. Septra/Bactrim, Flagyl, Cipro) °Amiodarone (Cordarone) °Aspirin (ASA) °Cimetidine (Tagamet) °Megestrol (Megace) °NSAIDs (ibuprofen, naproxen, etc.) °Piroxicam (Feldene) °Propafenone (Rythmol SR) °Propranolol (Inderal) °Isoniazid (INH) °Posaconazole (Noxafil) Barbiturates (Phenobarbital) °Carbamazepine (Tegretol) °Chlordiazepoxide (Librium) °Cholestyramine (Questran) °Griseofulvin °Oral Contraceptives °Rifampin °Sucralfate (Carafate) °Vitamin K  ° °Coumadin® (Warfarin) Major Herbal   Interactions  °Increased Warfarin Effect Decreased Warfarin Effect  °Garlic °Ginseng °Ginkgo biloba Coenzyme Q10 °Green tea °St. John’s wort   ° °Coumadin® (Warfarin) FOOD Interactions  °Eat a consistent number of servings per week of foods HIGH in Vitamin K °(1 serving = ½ cup)  °Collards  (cooked, or boiled & drained) °Kale (cooked, or boiled & drained) °Mustard greens (cooked, or boiled & drained) °Parsley *serving size only = ¼ cup °Spinach (cooked, or boiled & drained) °Swiss chard (cooked, or boiled & drained) °Turnip greens (cooked, or boiled & drained)  °Eat a consistent number of servings per week of foods MEDIUM-HIGH in Vitamin K °(1 serving = 1 cup)  °Asparagus (cooked, or boiled & drained) °Broccoli (cooked, boiled & drained, or raw & chopped) °Brussel sprouts (cooked, or boiled & drained) *serving size only = ½ cup °Lettuce, raw (green leaf, endive, romaine) °Spinach, raw °Turnip greens, raw & chopped  ° °These websites have more information on Coumadin (warfarin):  www.coumadin.com; °www.ahrq.gov/consumer/coumadin.htm; ° ° °
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
## 2017-04-03 NOTE — Progress Notes (Signed)
Orthopedic Tech Progress Note Patient Details:  Ricardo Friedman Feb 23, 1971 [id number]  Ortho Devices Type of Ortho Device: Arm sling Ortho Device/Splint Location: Nurse requested Arm sling for pt. Met O/t and p/t at room working with pt.  therapy applied arm sling to pt.   Right Arm. Ortho Device/Splint Interventions: Ordered, Application   Kristopher Oppenheim 04/03/2017, 3:24 PM
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
## 2017-04-03 NOTE — Progress Notes (Signed)
Occupational Therapy Treatment Patient Details Name: Annamary RummageRichard Jewel MRN: [id number] DOB: 08/23/1971 Today's Date: 04/03/2017    History of present illness pt is a 46 y/o male with no significant PMH, [facility], R UE brachial plexus injury, R forehead lac, complex 3 ring pelvic fx, open L fibular fx.  Pt s/p ORIF pelvic fracture/symphysis pubis, SI pinning, fibular pinning post VAC dressing.   OT comments  Pt progressing towards established goals and motivated to participate in therapy. Pt performed bed mobility with Max A +2 transitioning from supine to EOB. Provided education on compensatory techniques for UB dressing; pt performed UB dressing with Mod A at EOB. Provided education on edema management, positioning RUE, sling management, and splint wear; pt and girlfriend verbalized understanding. Continue to recommend dc to CIR for further OT to optimize safety and independence with ADLs and functional mobility. Will continue to follow acutely to facilitate safe dc.   Follow Up Recommendations  CIR;Supervision/Assistance - 24 hour    Equipment Recommendations  Other (comment) (TBD at next venue)    Recommendations for Other Services Rehab consult    Precautions / Restrictions Precautions Precautions: Fall Required Braces or Orthoses: Sling;Cervical Brace (R UE in resting hand splint) Cervical Brace: Hard collar;At all times Restrictions Weight Bearing Restrictions: Yes RUE Weight Bearing: Non weight bearing RLE Weight Bearing: Non weight bearing LLE Weight Bearing: Weight bearing as tolerated (for transfers only)       Mobility Bed Mobility Overal bed mobility: Needs Assistance Bed Mobility: Rolling;Sidelying to Sit;Sit to Sidelying Rolling: Mod assist Sidelying to sit: Max assist;+2 for physical assistance     Sit to sidelying: Max assist;+2 for physical assistance General bed mobility comments: max directional v/c's, modA for LE management, maxA  for trunk elevation due to inability to push up with R UE. Pt able to pull self with L UE to r sidelying but unable to push up  Transfers                 General transfer comment: attempted to work on lateral scooting along EOB with use of LUE and L LE, pt with minimal clearance but did scoot x4 trials    Balance Overall balance assessment: Needs assistance Sitting-balance support: Single extremity supported Sitting balance-Leahy Scale: Poor Sitting balance - Comments: due to pain unable to sit on his own without external asisst                                   ADL either performed or assessed with clinical judgement   ADL Overall ADL's : Needs assistance/impaired                 Upper Body Dressing : Moderate assistance;Sitting Upper Body Dressing Details (indicate cue type and reason): Educated pt on UB dressing techniques. Pt required Mod A to bring R sleeve over RUE while pt managed R hand                   General ADL Comments: Pt performed bed mobility to EOB. Pt reports feeling dizzy throughout sitting at EOB during ADLs. Continued to educate pt and girlfriend about RUE positioning and edema management. Pt and girlfriend verbalized understanding. Also, educated pt and girlfriend on sling and slint management and positioning.     Vision       Perception     Praxis  Cognition Arousal/Alertness: Awake/alert Behavior During Therapy: WFL for tasks assessed/performed Overall Cognitive Status: Within Functional Limits for tasks assessed                                          Exercises Exercises: General Upper Extremity General Exercises - Upper Extremity Shoulder Flexion: PROM;Right;5 reps;Supine Elbow Flexion: PROM;Right;Supine;5 reps Wrist Flexion: PROM;Right;5 reps;Supine Wrist Extension: PROM;Right;5 reps;Supine Digit Composite Flexion: PROM;Right;5 reps;Supine Composite Extension: PROM;Right;5  reps;Supine Girlfriend present and verbalized understanding  Shoulder Instructions       General Comments pt c/o "I"m so hot", "I have vertigo"    Pertinent Vitals/ Pain       Pain Assessment: 0-10 Pain Score: 8  Pain Location: back and spine Pain Descriptors / Indicators: Grimacing;Guarding Pain Intervention(s): Monitored during session;Limited activity within patient's tolerance;Repositioned  Home Living                                          Prior Functioning/Environment              Frequency  Min 3X/week        Progress Toward Goals  OT Goals(current goals can now be found in the care plan section)  Progress towards OT goals: Progressing toward goals  Acute Rehab OT Goals Patient Stated Goal: go back with girlfriend OT Goal Formulation: With patient/family Time For Goal Achievement: 04/15/17 Potential to Achieve Goals: Good ADL Goals Pt Will Perform Upper Body Dressing: with caregiver independent in assisting;sitting;with min guard assist Pt Will Transfer to Toilet: with max assist;stand pivot transfer;bedside commode Additional ADL Goal #1: Pt will perform bed mobility with mod assist as precursor to ADL. Additional ADL Goal #2: Pt/caregiver will independently demonstrate RUE PROM exercises. Additional ADL Goal #3: Pt will independently verbalize two edema management techniques  Plan Discharge plan remains appropriate    Co-evaluation    PT/OT/SLP Co-Evaluation/Treatment: Yes Reason for Co-Treatment: Complexity of the patient's impairments (multi-system involvement) PT goals addressed during session: Mobility/safety with mobility OT goals addressed during session: ADL's and self-care      AM-PAC PT "6 Clicks" Daily Activity     Outcome Measure   Help from another person eating meals?: A Little Help from another person taking care of personal grooming?: A Lot Help from another person toileting, which includes using toliet,  bedpan, or urinal?: A Lot Help from another person bathing (including washing, rinsing, drying)?: A Lot Help from another person to put on and taking off regular upper body clothing?: A Lot Help from another person to put on and taking off regular lower body clothing?: A Lot 6 Click Score: 13    End of Session Equipment Utilized During Treatment:  (Sling; resting hand splint)  OT Visit Diagnosis: Other abnormalities of gait and mobility (R26.89);Muscle weakness (generalized) (M62.81);Pain;Hemiplegia and hemiparesis Hemiplegia - Right/Left: Right Hemiplegia - dominant/non-dominant: Dominant Pain - Right/Left: Right Pain - part of body: Shoulder   Activity Tolerance Patient tolerated treatment well;Patient limited by pain;Patient limited by fatigue   Patient Left in bed;with call bell/phone within reach;with family/visitor present   Nurse Communication Weight bearing status;Precautions;Mobility status        Time: 1454-1530 OT Time Calculation (min): 36 min  Charges: OT General Charges $OT Visit: 1 Procedure OT Treatments $Self Care/Home Management :  8-22 mins  Mia Winthrop MSOT, OTR/L Acute Rehab Pager: [phone] Office: [phone]   Theodoro Grist Brennyn Haisley 04/03/2017, 4:43 PM
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
## 2017-04-04 ENCOUNTER — Encounter (HOSPITAL_COMMUNITY): Payer: Self-pay | Admitting: *Deleted

## 2017-04-04 LAB — COMPREHENSIVE METABOLIC PANEL
ALBUMIN: 3.4 g/dL — AB (ref 3.5–5.0)
ALT: 70 U/L — ABNORMAL HIGH (ref 17–63)
ANION GAP: 11 (ref 5–15)
AST: 101 U/L — ABNORMAL HIGH (ref 15–41)
Alkaline Phosphatase: 96 U/L (ref 38–126)
BUN: 11 mg/dL (ref 6–20)
CHLORIDE: 100 mmol/L — AB (ref 101–111)
CO2: 22 mmol/L (ref 22–32)
Calcium: 8.9 mg/dL (ref 8.9–10.3)
Creatinine, Ser: 0.66 mg/dL (ref 0.61–1.24)
GFR calc Af Amer: >60 mL/min (ref >60)
GFR calc non Af Amer: >60 mL/min (ref >60)
GLUCOSE: 116 mg/dL — AB (ref 65–99)
POTASSIUM: 4.3 mmol/L (ref 3.5–5.1)
Sodium: 133 mmol/L — ABNORMAL LOW (ref 135–145)
TOTAL PROTEIN: 6.5 g/dL (ref 6.5–8.1)
Total Bilirubin: 1.7 mg/dL — ABNORMAL HIGH (ref 0.3–1.2)

## 2017-04-04 LAB — CBC
HEMATOCRIT: 36.2 % — AB (ref 39.0–52.0)
HEMOGLOBIN: 12.6 g/dL — AB (ref 13.0–17.0)
MCH: 31.5 pg (ref 26.0–34.0)
MCHC: 34.8 g/dL (ref 30.0–36.0)
MCV: 90.5 fL (ref 78.0–100.0)
Platelets: 259 10*3/uL (ref 150–400)
RBC: 4 MIL/uL — ABNORMAL LOW (ref 4.22–5.81)
RDW: 14 % (ref 11.5–15.5)
WBC: 10.9 10*3/uL — ABNORMAL HIGH (ref 4.0–10.5)

## 2017-04-04 LAB — PROTIME-INR
INR: 1.05
Prothrombin Time: 13.7 s (ref 11.4–15.2)

## 2017-04-04 MED ORDER — BISACODYL 10 MG RE SUPP: 10.0000 mg | Freq: Every day | RECTAL | Status: DC | PRN

## 2017-04-04 MED ORDER — WARFARIN SODIUM 7.5 MG PO TABS
7.5000 mg | ORAL_TABLET | Freq: Once | ORAL | Status: AC
  Administered 2017-04-04: 7.5 mg via ORAL
  Filled 2017-04-04: qty 1

## 2017-04-04 MED ORDER — MAGNESIUM CITRATE PO SOLN
1.0000 | Freq: Once | ORAL | Status: AC
  Administered 2017-04-04: 1 via ORAL
  Filled 2017-04-04: qty 296

## 2017-04-04 MED ORDER — HYDROMORPHONE HCL 1 MG/ML IJ SOLN
0.5000 mg | INTRAMUSCULAR | Status: DC | PRN
  Administered 2017-04-04 – 2017-04-05 (×3): 1 mg via INTRAVENOUS
  Filled 2017-04-04 (×3): qty 1

## 2017-04-04 NOTE — Progress Notes (Signed)
Orthopedic Tech Progress Note Patient Details:  Ricardo RummageRichard Friedman 12/29/1970 [id number]  Ortho Devices Type of Ortho Device: Arm sling Ortho Device/Splint Location: trapeze bar patient helper Ortho Device/Splint Interventions: Ordered, Application   [name] 04/04/2017, 10:26 AM
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
## 2017-04-04 NOTE — Progress Notes (Signed)
Paged ortho tec for trapeze bar.
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
## 2017-04-05 ENCOUNTER — Inpatient Hospital Stay (HOSPITAL_COMMUNITY)
Admit: 2017-04-05 | Discharge: 2017-04-15 | DRG: 559 | Disposition: A | Discharge status: Home Health | Payer: Commercial Managed Care - PPO | Source: Intra-hospital | Attending: Physical Medicine & Rehabilitation | Admitting: Physical Medicine & Rehabilitation

## 2017-04-05 ENCOUNTER — Inpatient Hospital Stay (HOSPITAL_COMMUNITY): Payer: Commercial Managed Care - PPO

## 2017-04-05 ENCOUNTER — Encounter (HOSPITAL_COMMUNITY): Payer: Self-pay | Admitting: *Deleted

## 2017-04-05 DIAGNOSIS — E871 Hypo-osmolality and hyponatremia: Secondary | ICD-10-CM | POA: Diagnosis present

## 2017-04-05 DIAGNOSIS — D62 Acute posthemorrhagic anemia: Secondary | ICD-10-CM | POA: Diagnosis present

## 2017-04-05 DIAGNOSIS — S32810D Multiple fractures of pelvis with stable disruption of pelvic ring, subsequent encounter for fracture with routine healing: Secondary | ICD-10-CM | POA: Diagnosis present

## 2017-04-05 DIAGNOSIS — R791 Abnormal coagulation profile: Secondary | ICD-10-CM

## 2017-04-05 DIAGNOSIS — R7401 Elevation of levels of liver transaminase levels: Secondary | ICD-10-CM

## 2017-04-05 DIAGNOSIS — J189 Pneumonia, unspecified organism: Secondary | ICD-10-CM | POA: Diagnosis present

## 2017-04-05 DIAGNOSIS — S12591S Other nondisplaced fracture of sixth cervical vertebra, sequela: Secondary | ICD-10-CM

## 2017-04-05 DIAGNOSIS — Y95 Nosocomial condition: Secondary | ICD-10-CM | POA: Diagnosis present

## 2017-04-05 DIAGNOSIS — S143XXD Injury of brachial plexus, subsequent encounter: Secondary | ICD-10-CM

## 2017-04-05 DIAGNOSIS — R339 Retention of urine, unspecified: Secondary | ICD-10-CM

## 2017-04-05 DIAGNOSIS — B9620 Unspecified Escherichia coli [E. coli] as the cause of diseases classified elsewhere: Secondary | ICD-10-CM | POA: Diagnosis present

## 2017-04-05 DIAGNOSIS — D72829 Elevated white blood cell count, unspecified: Secondary | ICD-10-CM | POA: Diagnosis not present

## 2017-04-05 DIAGNOSIS — E8809 Other disorders of plasma-protein metabolism, not elsewhere classified: Secondary | ICD-10-CM | POA: Diagnosis present

## 2017-04-05 DIAGNOSIS — S12500S Unspecified displaced fracture of sixth cervical vertebra, sequela: Secondary | ICD-10-CM | POA: Diagnosis not present

## 2017-04-05 DIAGNOSIS — E46 Unspecified protein-calorie malnutrition: Secondary | ICD-10-CM

## 2017-04-05 DIAGNOSIS — S0081XD Abrasion of other part of head, subsequent encounter: Secondary | ICD-10-CM | POA: Diagnosis not present

## 2017-04-05 DIAGNOSIS — S0003XD Contusion of scalp, subsequent encounter: Secondary | ICD-10-CM

## 2017-04-05 DIAGNOSIS — K5900 Constipation, unspecified: Secondary | ICD-10-CM | POA: Diagnosis present

## 2017-04-05 DIAGNOSIS — S82402D Unspecified fracture of shaft of left fibula, subsequent encounter for closed fracture with routine healing: Secondary | ICD-10-CM | POA: Diagnosis not present

## 2017-04-05 DIAGNOSIS — N390 Urinary tract infection, site not specified: Secondary | ICD-10-CM | POA: Diagnosis present

## 2017-04-05 DIAGNOSIS — R8290 Unspecified abnormal findings in urine: Secondary | ICD-10-CM | POA: Diagnosis not present

## 2017-04-05 DIAGNOSIS — S3210XD Unspecified fracture of sacrum, subsequent encounter for fracture with routine healing: Secondary | ICD-10-CM

## 2017-04-05 DIAGNOSIS — S12500A Unspecified displaced fracture of sixth cervical vertebra, initial encounter for closed fracture: Secondary | ICD-10-CM | POA: Diagnosis present

## 2017-04-05 DIAGNOSIS — F17210 Nicotine dependence, cigarettes, uncomplicated: Secondary | ICD-10-CM | POA: Diagnosis present

## 2017-04-05 DIAGNOSIS — S143XXS Injury of brachial plexus, sequela: Secondary | ICD-10-CM

## 2017-04-05 DIAGNOSIS — M792 Neuralgia and neuritis, unspecified: Secondary | ICD-10-CM | POA: Diagnosis not present

## 2017-04-05 DIAGNOSIS — R740 Nonspecific elevation of levels of transaminase and lactic acid dehydrogenase [LDH]: Secondary | ICD-10-CM

## 2017-04-05 DIAGNOSIS — S12500D Unspecified displaced fracture of sixth cervical vertebra, subsequent encounter for fracture with routine healing: Secondary | ICD-10-CM | POA: Diagnosis not present

## 2017-04-05 DIAGNOSIS — R030 Elevated blood-pressure reading, without diagnosis of hypertension: Secondary | ICD-10-CM

## 2017-04-05 DIAGNOSIS — M7989 Other specified soft tissue disorders: Secondary | ICD-10-CM | POA: Diagnosis not present

## 2017-04-05 DIAGNOSIS — D72825 Bandemia: Secondary | ICD-10-CM | POA: Diagnosis not present

## 2017-04-05 LAB — URINALYSIS, ROUTINE W REFLEX MICROSCOPIC
BILIRUBIN URINE: NEGATIVE
GLUCOSE, UA: NEGATIVE mg/dL
KETONES UR: NEGATIVE mg/dL
NITRITE: NEGATIVE
PROTEIN: NEGATIVE mg/dL
Specific Gravity, Urine: 1.009 (ref 1.005–1.030)
Squamous Epithelial / LPF: NONE SEEN
pH: 6 (ref 5.0–8.0)

## 2017-04-05 LAB — COMPREHENSIVE METABOLIC PANEL
ALBUMIN: 3 g/dL — AB (ref 3.5–5.0)
ALT: 88 U/L — ABNORMAL HIGH (ref 17–63)
ANION GAP: 10 (ref 5–15)
AST: 110 U/L — ABNORMAL HIGH (ref 15–41)
Alkaline Phosphatase: 165 U/L — ABNORMAL HIGH (ref 38–126)
BILIRUBIN TOTAL: 1.7 mg/dL — AB (ref 0.3–1.2)
BUN: 13 mg/dL (ref 6–20)
CO2: 25 mmol/L (ref 22–32)
Calcium: 8.6 mg/dL — ABNORMAL LOW (ref 8.9–10.3)
Chloride: 94 mmol/L — ABNORMAL LOW (ref 101–111)
Creatinine, Ser: 0.67 mg/dL (ref 0.61–1.24)
GFR calc non Af Amer: >60 mL/min (ref >60)
GLUCOSE: 116 mg/dL — AB (ref 65–99)
POTASSIUM: 4.4 mmol/L (ref 3.5–5.1)
SODIUM: 129 mmol/L — AB (ref 135–145)
TOTAL PROTEIN: 6.3 g/dL — AB (ref 6.5–8.1)

## 2017-04-05 LAB — CBC
HEMATOCRIT: 35.2 % — AB (ref 39.0–52.0)
HEMOGLOBIN: 12 g/dL — AB (ref 13.0–17.0)
MCH: 30.5 pg (ref 26.0–34.0)
MCHC: 34.1 g/dL (ref 30.0–36.0)
MCV: 89.6 fL (ref 78.0–100.0)
Platelets: 267 10*3/uL (ref 150–400)
RBC: 3.93 MIL/uL — AB (ref 4.22–5.81)
RDW: 13.9 % (ref 11.5–15.5)
WBC: 12 10*3/uL — ABNORMAL HIGH (ref 4.0–10.5)

## 2017-04-05 LAB — PROTIME-INR
INR: 1.36
PROTHROMBIN TIME: 16.9 s — AB (ref 11.4–15.2)

## 2017-04-05 MED ORDER — BETHANECHOL CHLORIDE 10 MG PO TABS
10.0000 mg | ORAL_TABLET | Freq: Three times a day (TID) | ORAL | Status: DC
  Administered 2017-04-05 – 2017-04-15 (×29): 10 mg via ORAL
  Filled 2017-04-05 (×28): qty 1

## 2017-04-05 MED ORDER — TAMSULOSIN HCL 0.4 MG PO CAPS
0.4000 mg | ORAL_CAPSULE | Freq: Every day | ORAL | Status: DC
  Administered 2017-04-06 – 2017-04-15 (×10): 0.4 mg via ORAL
  Filled 2017-04-05 (×11): qty 1

## 2017-04-05 MED ORDER — CALCIUM CARBONATE ANTACID 500 MG PO CHEW: 2.0000 | CHEWABLE_TABLET | Freq: Three times a day (TID) | ORAL | Status: DC | PRN

## 2017-04-05 MED ORDER — WARFARIN - PHARMACIST DOSING INPATIENT
Freq: Every day | Status: DC
  Administered 2017-04-07 – 2017-04-14 (×3)

## 2017-04-05 MED ORDER — OXYCODONE HCL 5 MG PO TABS
5.0000 mg | ORAL_TABLET | ORAL | Status: DC | PRN
  Administered 2017-04-05 – 2017-04-11 (×19): 10 mg via ORAL
  Administered 2017-04-12: 5 mg via ORAL
  Administered 2017-04-12 – 2017-04-15 (×6): 10 mg via ORAL
  Filled 2017-04-05 (×27): qty 2

## 2017-04-05 MED ORDER — ONDANSETRON HCL 4 MG/2ML IJ SOLN: 4.0000 mg | Freq: Four times a day (QID) | INTRAMUSCULAR | Status: DC | PRN

## 2017-04-05 MED ORDER — WARFARIN SODIUM 7.5 MG PO TABS
7.5000 mg | ORAL_TABLET | Freq: Once | ORAL | Status: AC
  Administered 2017-04-05: 7.5 mg via ORAL
  Filled 2017-04-05: qty 1

## 2017-04-05 MED ORDER — SENNOSIDES-DOCUSATE SODIUM 8.6-50 MG PO TABS
1.0000 | ORAL_TABLET | Freq: Two times a day (BID) | ORAL | Status: DC
  Administered 2017-04-05 – 2017-04-15 (×11): 1 via ORAL
  Filled 2017-04-05 (×20): qty 1

## 2017-04-05 MED ORDER — ENOXAPARIN SODIUM 40 MG/0.4ML ~~LOC~~ SOLN
40.0000 mg | SUBCUTANEOUS | Status: DC
  Administered 2017-04-06: 40 mg via SUBCUTANEOUS
  Filled 2017-04-05 (×2): qty 0.4

## 2017-04-05 MED ORDER — ONDANSETRON HCL 4 MG PO TABS: 4.0000 mg | ORAL_TABLET | Freq: Four times a day (QID) | ORAL | Status: DC | PRN

## 2017-04-05 MED ORDER — POLYETHYLENE GLYCOL 3350 17 G PO PACK: 17.0000 g | PACK | Freq: Every day | ORAL | Status: DC | PRN

## 2017-04-05 MED ORDER — PANTOPRAZOLE SODIUM 40 MG IV SOLR
40.0000 mg | Freq: Every day | INTRAVENOUS | Status: DC
  Filled 2017-04-05 (×5): qty 40

## 2017-04-05 MED ORDER — BISACODYL 10 MG RE SUPP
10.0000 mg | Freq: Every day | RECTAL | Status: DC | PRN
  Filled 2017-04-05: qty 1

## 2017-04-05 MED ORDER — SORBITOL 70 % SOLN
30.0000 mL | Freq: Every day | Status: DC | PRN
  Administered 2017-04-05: 30 mL via ORAL
  Filled 2017-04-05: qty 30

## 2017-04-05 MED ORDER — WARFARIN SODIUM 7.5 MG PO TABS: 7.5000 mg | ORAL_TABLET | Freq: Once | ORAL | Status: DC

## 2017-04-05 MED ORDER — PANTOPRAZOLE SODIUM 40 MG PO TBEC
40.0000 mg | DELAYED_RELEASE_TABLET | Freq: Every day | ORAL | Status: DC
  Administered 2017-04-06 – 2017-04-15 (×10): 40 mg via ORAL
  Filled 2017-04-05 (×10): qty 1

## 2017-04-05 MED ORDER — TRAMADOL HCL 50 MG PO TABS
50.0000 mg | ORAL_TABLET | Freq: Three times a day (TID) | ORAL | Status: DC
  Administered 2017-04-05 – 2017-04-15 (×28): 50 mg via ORAL
  Filled 2017-04-05 (×30): qty 1

## 2017-04-05 NOTE — Progress Notes (Signed)
In patient rehab called and I gave report to RN. Waiting on discharge order.
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
## 2017-04-05 NOTE — Progress Notes (Signed)
Rehab admissions - I have approval from [facility] for acute inpatient rehab admission for today.  I met with patient and he is agreeable.  Bed available and will admit to acute inpatient rehab today.  Call me for questions.  #[phone]
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
## 2017-04-05 NOTE — Progress Notes (Signed)
Pupils unequal. PA notified. Patient alert and oriented per usual and grips on right strong. NNO's at this time.

## 2017-04-05 NOTE — Progress Notes (Signed)
[facility] Progress Note  6 Days Post-Op   Subjective:  CC: pain in hip and LLE  Pt had acid reflux overnight. No hx of GERD. Mild productive cough. Pt states he has been hot in his room. One episode of chills after showering. No feeling of fever. No nausea or vomiting. No increased chest pain or SOB.   Objective: Vital signs in last 24 hours: Temp:  [98.4 F (36.9 C)-100.7 F (38.2 C)] 99 F (37.2 C) (07/04 0800) Pulse Rate:  [104-117] 105 (07/04 0800) Resp:  [16-18] 16 (07/04 0800) BP: (124-134)/(75-81) 134/81 (07/04 0800) SpO2:  [93 %-96 %] 96 % (07/04 0800) Last BM Date: 03/30/17  Intake/Output from previous day: 07/03 0701 - 07/04 0700 In: 760 [P.O.:760] Out: 600 [Urine:600] Intake/Output this shift: Total I/O In: -  Out: 1700 [Urine:1700]  PE: Gen: Alert, NAD, pleasant, cooperative  HEENT: pupils equal, conjunctive normal without hemorrhage or injection Card: RRR, no M/G/R heard Pulm: CTAB, no W/R/R, effort normal Abd: Soft, ND, +BS, no HSM, no TTP, sutures in suprapubic region without surrounding erythema or drainage RUE: splint intact, sensation to posterior upper arm, no motor function, edema resolved BLE: dressing to LLE. Sensation intact, able to wiggle toes, 2+ PTon RLE, 1+ pitting edema of LLE Psych: A&Ox3   Lab Results:   Recent Labs  04/04/17 0612 04/05/17 0414  WBC 10.9* 12.0*  HGB 12.6* 12.0*  HCT 36.2* 35.2*  PLT 259 267   BMET  Recent Labs  04/04/17 0612 04/05/17 0414  NA 133* 129*  K 4.3 4.4  CL 100* 94*  CO2 22 25  GLUCOSE 116* 116*  BUN 11 13  CREATININE 0.66 0.67  CALCIUM 8.9 8.6*   PT/INR  Recent Labs  04/04/17 0612 04/05/17 0414  LABPROT 13.7 16.9*  INR 1.05 1.36   CMP     Component Value Date/Time   NA 129 (L) 04/05/2017 0414   K 4.4 04/05/2017 0414   CL 94 (L) 04/05/2017 0414   CO2 25 04/05/2017 0414   GLUCOSE 116 (H) 04/05/2017 0414   BUN 13 04/05/2017 0414   CREATININE 0.67  04/05/2017 0414   CALCIUM 8.6 (L) 04/05/2017 0414   PROT 6.3 (L) 04/05/2017 0414   ALBUMIN 3.0 (L) 04/05/2017 0414   AST 110 (H) 04/05/2017 0414   ALT 88 (H) 04/05/2017 0414   ALKPHOS 165 (H) 04/05/2017 0414   BILITOT 1.7 (H) 04/05/2017 0414   GFRNONAA >60 04/05/2017 0414   GFRAA >60 04/05/2017 0414   Lipase  No results found for: LIPASE  Studies/Results: No results found.  Anti-infectives: Anti-infectives    Start     Dose/Rate Route Frequency Ordered Stop   03/30/17 2200  ceFAZolin (ANCEF) IVPB 1 g/50 mL premix     1 g 100 mL/hr over 30 Minutes Intravenous Every 8 hours 03/30/17 1749 04/02/17 1449   03/30/17 1030  ceFAZolin (ANCEF) IVPB 2g/100 mL premix     2 g 200 mL/hr over 30 Minutes Intravenous On call to O.R. 03/30/17 1017 03/30/17 1210   03/30/17 1019  ceFAZolin (ANCEF) 2-4 GM/100ML-% IVPB    Comments:  Lorenda IshiharaGibbs, Bonnie   : cabinet override      03/30/17 1019 03/30/17 1210   03/30/17 0615  ceFAZolin (ANCEF) IVPB 1 g/50 mL premix     1 g 100 mL/hr over 30 Minutes Intravenous  Once 03/30/17 0614 03/30/17 0706       Assessment/Plan [facility] C6 FX with widened facet- MR done with widening of  c6-7 facet, recommended c collar until follow up Right brachial plexus injury with MRI showing c8 and t1 nerve root avulsion, F/U with Dr. Dierdre Searles at [facility] set up R forehead lac- repair in ED 6/28, sutures removed Complex pelvic FX with open book/sacral FX/R SI diastasis- s/p ORIF ant ring and sacral screws, per ortho Open L fibula FX- wound vac removed, wound care per ortho RUE abrasions- dressing change per ortho Urinary retention - on flomax and urecholine, another foley placed 7/3, voiding trial perhaps tomorrow 7/5 Fever: pending UA, urine culture, chest xray  ID - ancef 6/28>>7/1 FEN - Regular diet VTE - Scds, lovenox & warfarin  Plan - Continue PT/OT. CIR following. Chest xray, UA and urine culture pending. On protonix, added tums CIR may take pt today   LOS: 6  days    Jerre Simon , [facility] 04/05/2017, 8:47 AM Pager: [phone] Consults: [phone] Mon-Fri 7:00 am-4:30 pm Sat-Sun 7:00 am-11:30 am
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
## 2017-04-05 NOTE — Progress Notes (Signed)
Reviewed AVS with patient.  Patient is stable and ready to go to in patient rehab.

## 2017-04-06 ENCOUNTER — Inpatient Hospital Stay (HOSPITAL_COMMUNITY): Payer: Self-pay | Admitting: Occupational Therapy

## 2017-04-06 ENCOUNTER — Inpatient Hospital Stay (HOSPITAL_COMMUNITY)
Admit: 2017-04-06 | Discharge: 2017-04-06 | Disposition: A | Discharge status: Home / Self Care | Payer: Commercial Managed Care - PPO | Source: Intra-hospital | Attending: Physician Assistant | Admitting: Physician Assistant

## 2017-04-06 ENCOUNTER — Inpatient Hospital Stay (HOSPITAL_COMMUNITY): Payer: Commercial Managed Care - PPO | Admitting: Occupational Therapy

## 2017-04-06 ENCOUNTER — Inpatient Hospital Stay (HOSPITAL_COMMUNITY): Payer: Self-pay | Admitting: Physical Therapy

## 2017-04-06 DIAGNOSIS — R739 Hyperglycemia, unspecified: Secondary | ICD-10-CM

## 2017-04-06 DIAGNOSIS — M792 Neuralgia and neuritis, unspecified: Secondary | ICD-10-CM

## 2017-04-06 DIAGNOSIS — R740 Nonspecific elevation of levels of transaminase and lactic acid dehydrogenase [LDH]: Secondary | ICD-10-CM

## 2017-04-06 DIAGNOSIS — D62 Acute posthemorrhagic anemia: Secondary | ICD-10-CM

## 2017-04-06 DIAGNOSIS — R339 Retention of urine, unspecified: Secondary | ICD-10-CM

## 2017-04-06 DIAGNOSIS — D72829 Elevated white blood cell count, unspecified: Secondary | ICD-10-CM

## 2017-04-06 DIAGNOSIS — R7401 Elevation of levels of liver transaminase levels: Secondary | ICD-10-CM

## 2017-04-06 DIAGNOSIS — E871 Hypo-osmolality and hyponatremia: Secondary | ICD-10-CM

## 2017-04-06 DIAGNOSIS — E46 Unspecified protein-calorie malnutrition: Secondary | ICD-10-CM

## 2017-04-06 DIAGNOSIS — R791 Abnormal coagulation profile: Secondary | ICD-10-CM

## 2017-04-06 DIAGNOSIS — M7989 Other specified soft tissue disorders: Secondary | ICD-10-CM

## 2017-04-06 LAB — COMPREHENSIVE METABOLIC PANEL
ALBUMIN: 3.1 g/dL — AB (ref 3.5–5.0)
ALK PHOS: 142 U/L — AB (ref 38–126)
ALT: 96 U/L — AB (ref 17–63)
AST: 113 U/L — AB (ref 15–41)
Anion gap: 10 (ref 5–15)
BILIRUBIN TOTAL: 1.1 mg/dL (ref 0.3–1.2)
BUN: 12 mg/dL (ref 6–20)
CALCIUM: 8.6 mg/dL — AB (ref 8.9–10.3)
CO2: 23 mmol/L (ref 22–32)
CREATININE: 0.57 mg/dL — AB (ref 0.61–1.24)
Chloride: 96 mmol/L — ABNORMAL LOW (ref 101–111)
GFR calc Af Amer: >60 mL/min (ref >60)
GFR calc non Af Amer: >60 mL/min (ref >60)
GLUCOSE: 122 mg/dL — AB (ref 65–99)
Potassium: 3.8 mmol/L (ref 3.5–5.1)
Sodium: 129 mmol/L — ABNORMAL LOW (ref 135–145)
TOTAL PROTEIN: 6.3 g/dL — AB (ref 6.5–8.1)

## 2017-04-06 LAB — CBC WITH DIFFERENTIAL/PLATELET
BASOS ABS: 0.1 10*3/uL (ref 0.0–0.1)
BASOS PCT: 1 %
Eosinophils Absolute: 0.1 10*3/uL (ref 0.0–0.7)
Eosinophils Relative: 1 %
HEMATOCRIT: 34.2 % — AB (ref 39.0–52.0)
HEMOGLOBIN: 11.6 g/dL — AB (ref 13.0–17.0)
LYMPHS PCT: 19 %
Lymphs Abs: 2.3 10*3/uL (ref 0.7–4.0)
MCH: 30.4 pg (ref 26.0–34.0)
MCHC: 33.9 g/dL (ref 30.0–36.0)
MCV: 89.5 fL (ref 78.0–100.0)
Monocytes Absolute: 1.3 10*3/uL — ABNORMAL HIGH (ref 0.1–1.0)
Monocytes Relative: 11 %
NEUTROS ABS: 8.6 10*3/uL — AB (ref 1.7–7.7)
NEUTROS PCT: 68 %
Platelets: 303 10*3/uL (ref 150–400)
RBC: 3.82 MIL/uL — ABNORMAL LOW (ref 4.22–5.81)
RDW: 13.9 % (ref 11.5–15.5)
WBC: 12.4 10*3/uL — ABNORMAL HIGH (ref 4.0–10.5)

## 2017-04-06 LAB — URINE CULTURE: Culture: NO GROWTH

## 2017-04-06 LAB — PROTIME-INR
INR: 1.76
Prothrombin Time: 20.8 seconds — ABNORMAL HIGH (ref 11.4–15.2)

## 2017-04-06 MED ORDER — PRO-STAT SUGAR FREE PO LIQD
30.0000 mL | Freq: Two times a day (BID) | ORAL | Status: DC
  Administered 2017-04-06 – 2017-04-14 (×18): 30 mL via ORAL
  Filled 2017-04-06 (×20): qty 30

## 2017-04-06 MED ORDER — GABAPENTIN 600 MG PO TABS
300.0000 mg | ORAL_TABLET | Freq: Three times a day (TID) | ORAL | Status: DC
  Administered 2017-04-06 – 2017-04-08 (×9): 300 mg via ORAL
  Filled 2017-04-06 (×9): qty 1

## 2017-04-06 MED ORDER — WARFARIN SODIUM 5 MG PO TABS
5.0000 mg | ORAL_TABLET | Freq: Once | ORAL | Status: AC
  Administered 2017-04-06: 5 mg via ORAL
  Filled 2017-04-06: qty 1

## 2017-04-06 NOTE — Care Management Note (Signed)
Inpatient Rehabilitation Center Individual Statement of Services  Patient Name:  Ricardo RummageRichard Friedman  Date:  04/06/2017  Welcome to the [facility]patient Rehabilitation [facility].  Our goal is to provide you with an individualized program based on your diagnosis and situation, designed to meet your specific needs.  With this comprehensive rehabilitation program, you will be expected to participate in at least 3 hours of rehabilitation therapies Monday-Friday, with modified therapy programming on the weekends.  Your rehabilitation program will include the following services:  Physical Therapy (PT), Occupational Therapy (OT), 24 hour per day rehabilitation nursing, Neuropsychology, Case Management (Social Worker), Rehabilitation Medicine, Nutrition Services and Pharmacy Services  Weekly team conferences will be held on Wednesday to discuss your progress.  Your Social Worker will talk with you frequently to get your input and to update you on team discussions.  Team conferences with you and your family in attendance may also be held.  Expected length of stay: 10-16 days  Overall anticipated outcome: supervision-min wheelchair level  Depending on your progress and recovery, your program may change. Your Social Worker will coordinate services and will keep you informed of any changes. Your Social Worker's name and contact numbers are listed  below.  The following services may also be recommended but are not provided by the Inpatient Rehabilitation Center:   Driving Evaluations  Home Health Rehabiltiation Services  Outpatient Rehabilitation Services  Vocational Rehabilitation   Arrangements will be made to provide these services after discharge if needed.  Arrangements include referral to agencies that provide these services.  Your insurance has been verified to be:  [facility] Your primary doctor is:  none  Pertinent information will be shared with your doctor and your insurance company.  Social Worker:   Dossie DerBecky Marionna Gonia, SW [phone] or ([phone]) [phone]  Information discussed with and copy given to patient by: Lucy Chrisupree, [name], 04/06/2017, 12:50 PM

## 2017-04-06 NOTE — Progress Notes (Signed)
Patient information reviewed and entered into eRehab system by Naya Ilagan, RN, CRRN, PPS Coordinator.  Information including medical coding and functional independence measure will be reviewed and updated through discharge.    
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
## 2017-04-06 NOTE — Progress Notes (Signed)
Occupational Therapy Note  Patient Details  Name: Annamary RummageRichard Frasier MRN: [id number] Date of Birth: 12/17/1970  Today's Date: 04/06/2017 OT Individual Time: 1000-1030 OT Individual Time Calculation (min): 30 min   Pain: Pt did have pain with movement but had been premedicated. He was able to tolerate therapy session.  Pt seen this session to address bed mobility and transfers. A w/c with B elevating leg rests and lap tray obtained for patient.  Pt worked on supine to sit with HOB slightly elevated by scooting legs off bed and then pushing L elbow/ forearm down to push up from bed with max A from therapist to flex at trunk. Pt was concerned this movement would hurt due to his incision site, but he said he was able to tolerate it well. Practiced a few scoot on bed to his left to see how much hip elevation he could get. He was not able to lift high enough to transfer to w/c.  Obtained a slide board, pt was then able to transfer to w/c towards his L with touching A. His sling strap was pulling on his C collar. Recommended that pt loosed strap and rest arm on lap tray when he is resting in w/c.  Pt in room with his girlfriend.   Treyana Sturgell 04/06/2017, 11:27 AM

## 2017-04-06 NOTE — Discharge Summary (Deleted)
  The note originally documented on this encounter has been moved the the encounter in which it belongs.  
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
## 2017-04-06 NOTE — Progress Notes (Signed)
Occupational Therapy Session Note  Patient Details  Name: Annamary RummageRichard Demmer MRN: [id number] Date of Birth: 04/09/1971  Today's Date: 04/06/2017 OT Individual Time: 1030-1130 OT Individual Time Calculation (min): 60 min    Short Term Goals: Week 1:     Skilled Therapeutic Interventions/Progress Updates:    Treatment session focused on ADL/self care training, transfer training, AE/AD training, safety awareness/precaution education, and weight shifting.  Upon entering room, pt upright in w/c with lap tray to R side for UE support while in sling. Pt able to recall his precautions and therapist provided demonstration on safe transfer techniques and ADLs with precautions. Pt provided and trained on using drop arm bedside commode with slide board. Pt transferred to his L side using slide board from w/c to commode with min A but non compliant with NWB to R LE. Needed tactile reinforcement during transfers for NWB. Pt completed w/c to bed transfer using slide board per request for toileting task. Required mod A for LB guiding from EOB to supine lying with use of bed rails. Pt required min A for bed rolling and required verbal reminders for awareness of R UE. Pt completed LB dressing task in bed using bridging and rolling techniques to pull pants down and up. Pt required assistance with taking off/on around feet. Pt used his hand held urinal while in bed and requested to stay in bed for a nap. Pt repositioned in bed with reinforcement to R LE during bed mob with mod A for optimal positioning. Call bell and phone left within reach.   Therapy Documentation Precautions:  Restrictions Weight Bearing Restrictions: Yes RUE Weight Bearing: Partial weight bearing Pain: Pain Assessment Pain Assessment: 0-10 Pain Score: 3  Pain Type: Acute pain Pain Location: Pelvis Pain Orientation: Mid;Left Pain Descriptors / Indicators: Aching Pain Onset: On-going Pain Intervention(s): Repositioned  See Function Navigator  for Current Functional Status.   Therapy/Group: Individual Therapy  Verdie ShireMolly  Jessup Ogas 04/06/2017, 12:08 PM
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
## 2017-04-06 NOTE — Progress Notes (Signed)
VASCULAR LAB PRELIMINARY  PRELIMINARY  PRELIMINARY  PRELIMINARY  Bilateral lower extremity venous duplex completed.    Preliminary report:  Bilateral:  No evidence of DVT, superficial thrombosis, or Baker's Cyst.   Jillian Warth, RVS 04/06/2017, 2:42 PM

## 2017-04-07 ENCOUNTER — Inpatient Hospital Stay (HOSPITAL_COMMUNITY): Payer: Commercial Managed Care - PPO | Admitting: Physical Therapy

## 2017-04-07 ENCOUNTER — Inpatient Hospital Stay (HOSPITAL_COMMUNITY): Payer: Commercial Managed Care - PPO | Admitting: Occupational Therapy

## 2017-04-07 ENCOUNTER — Inpatient Hospital Stay (HOSPITAL_COMMUNITY): Payer: Self-pay

## 2017-04-07 DIAGNOSIS — G8918 Other acute postprocedural pain: Secondary | ICD-10-CM

## 2017-04-07 DIAGNOSIS — R030 Elevated blood-pressure reading, without diagnosis of hypertension: Secondary | ICD-10-CM

## 2017-04-07 LAB — PROTIME-INR
INR: 2.35
Prothrombin Time: 26.2 seconds — ABNORMAL HIGH (ref 11.4–15.2)

## 2017-04-07 MED ORDER — METHOCARBAMOL 500 MG PO TABS
500.0000 mg | ORAL_TABLET | Freq: Three times a day (TID) | ORAL | Status: DC
  Administered 2017-04-07 – 2017-04-15 (×25): 500 mg via ORAL
  Filled 2017-04-07 (×24): qty 1

## 2017-04-07 MED ORDER — WARFARIN SODIUM 5 MG PO TABS
5.0000 mg | ORAL_TABLET | Freq: Once | ORAL | Status: AC
  Administered 2017-04-07: 5 mg via ORAL
  Filled 2017-04-07: qty 1

## 2017-04-07 NOTE — Progress Notes (Signed)
[name] PHYSICAL MEDICINE & REHABILITATION     PROGRESS NOTE  Subjective/Complaints:  Pt seen laying in bed this AM.  He states he did not sleep well because he was uncomfortable, partly because of pain, partly because of the bed.  ROS: Denies CP, SOB, N/V/D.  Objective: Vital Signs: Blood pressure (!) 158/80, pulse 73, temperature 99.2 F (37.3 C), temperature source Oral, resp. rate 16, height 5\' 10"  (1.778 m), weight 85.1 kg (187 lb 11.2 oz), SpO2 96 %. Dg Chest Port 1 View  Result Date: 04/05/2017 CLINICAL DATA:  Fever, cough EXAM: PORTABLE CHEST 1 VIEW COMPARISON:  03/30/2017 FINDINGS: Bibasilar atelectasis. Low lung volumes. Heart is mildly enlarged. No effusions or edema. No acute bony abnormality. IMPRESSION: Cardiomegaly. Low lung volumes with bibasilar atelectasis. Electronically Signed   By: Charlett NoseKevin  Dover M.D.   On: 04/05/2017 09:09    Recent Labs  04/05/17 0414 04/06/17 0636  WBC 12.0* 12.4*  HGB 12.0* 11.6*  HCT 35.2* 34.2*  PLT 267 303    Recent Labs  04/05/17 0414 04/06/17 0636  NA 129* 129*  K 4.4 3.8  CL 94* 96*  GLUCOSE 116* 122*  BUN 13 12  CREATININE 0.67 0.57*  CALCIUM 8.6* 8.6*   CBG (last 3)  No results for input(s): GLUCAP in the last 72 hours.  Wt Readings from Last 3 Encounters:  04/05/17 85.1 kg (187 lb 11.2 oz)  03/30/17 95.5 kg (210 lb 8.6 oz)    Physical Exam:  BP (!) 158/80 (BP Location: Left Arm)   Pulse 73   Temp 99.2 F (37.3 C) (Oral)   Resp 16   Ht 5\' 10"  (1.778 m)   Wt 85.1 kg (187 lb 11.2 oz)   SpO2 96%   BMI 26.93 kg/m  Constitutional: He appears well-developed. NAD.  HENT: Normocephalic. Healing abrasions to the forehead Eyes: EOMare normal. No discharge.   Neck: Cervical collar in place Cardiovascular: RRR. No JVD. Respiratory: Effort normal and breath sounds normal. GI: Soft. Bowel sounds are normal.  Musculoskeletal: He exhibits edema and tenderness.  Neurological: He is alertand oriented.  Motor: RUE:  Shoulder abduction 1+/5, distally 0/5  Sensation absent to light touch in RUE LUE: 5/5 proximal to distal B/l LE: HF 2/5, KE 2+/5, ADF/PF 4/5 (pain inhibition, stable).  Psychiatric: Normal mood and behavior Skin. Left lower extremity wound dressed. Pelvic wound clean and intact  Assessment/Plan: 1. Functional deficits secondary to polytrauma which require 3+ hours per day of interdisciplinary therapy in a comprehensive inpatient rehab setting. Physiatrist is providing close team supervision and 24 hour management of active medical problems listed below. Physiatrist and rehab team continue to assess barriers to discharge/monitor patient progress toward functional and medical goals.  Function:  Bathing Bathing position   Position: Bed  Bathing parts Body parts bathed by patient: Right arm, Chest, Abdomen, Front perineal area, Right upper leg, Left upper leg Body parts bathed by helper: Right lower leg, Left lower leg, Buttocks, Left arm, Back  Bathing assist        Upper Body Dressing/Undressing Upper body dressing   What is the patient wearing?: Pull over shirt/dress     Pull over shirt/dress - Perfomed by patient: Thread/unthread right sleeve, Thread/unthread left sleeve Pull over shirt/dress - Perfomed by helper: Put head through opening, Pull shirt over trunk        Upper body assist        Lower Body Dressing/Undressing Lower body dressing   What is the patient wearing?:  Pants, Non-skid slipper socks       Pants- Performed by helper: Thread/unthread right pants leg, Thread/unthread left pants leg, Pull pants up/down   Non-skid slipper socks- Performed by helper: Don/doff right sock, Don/doff left sock                  Lower body assist        Toileting Toileting   Toileting steps completed by patient: Adjust clothing prior to toileting Toileting steps completed by helper: Performs perineal hygiene (Used bedpan.)    Toileting assist Assist level:  Touching or steadying assistance (Pt.75%)   Transfers Chair/bed transfer   Chair/bed transfer method: Lateral scoot Chair/bed transfer assist level: 2 helpers (2nd helper for assist w/ NWB RLE) Chair/bed transfer assistive device: Sliding board     Locomotion Ambulation Ambulation activity did not occur: Safety/medical concerns         Wheelchair   Type: Manual Max wheelchair distance: 150 Assist Level: Maximal assistance (Pt 25 - 49%)  Cognition Comprehension Comprehension assist level: Follows basic conversation/direction with no assist, Follows complex conversation/direction with no assist  Expression Expression assist level: Expresses complex ideas: With no assist  Social Interaction Social Interaction assist level: Interacts appropriately with others - No medications needed.  Problem Solving Problem solving assist level: Solves basic problems with no assist  Memory Memory assist level: Complete Independence: No helper    Medical Problem List and Plan: 1. Decreased functional mobilitysecondary to anterior inferior left C6 vertebral corner fracture as well as widening of C6-7 facet joint with capsule disruption and ligamentous injury, C8 and T1 nerve root avulsion with right brachial plexus injury, cervical collar at all times, pelvic ring fracture with zone 3 fracture, right SI diastasis as well as left open fibular fracture status post ORIF anterior entrance sacral screw for sacral fracture. Weightbearing as tolerated left lower extremity for transfers only nonweightbearing right lower extremity.  Cont CIR  INR subtherapeutic 2. DVT Prophylaxis/Anticoagulation: Coumadin for DVT prophylaxis.  Vascular studies neg 3. Pain Management: Ultram 50 mg 3 times a day and oxycodone as needed  Gabapentin 300 TID started 7/5  Robaxin 500 TID started 7/6 4. Mood: Provide emotional support 5. Neuropsych: This patient iscapable of making decisions on hisown behalf. 6. Skin/Wound  Care: Skin care as directed 7. 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 Contact precautions 9.Urinary retention. Urecholine 10 mg 3 times a day as well as Flomax 0.4 mg daily.   PVR with some retention, cont meds  Foley d/ced 10.Constipation.Laxative assistance 11.Tobacco abuse. Counseling 12. Hyperglycemia  Likely stress induced 13. Hyponatremia  Na+ 129 on 7/5  Cont to monitor  Labs ordered for Monday 14. Transaminitis  Cont to follow 15. Hypoalbuminemia  Supplement initiated 7/5 16. ABLA  Hb 11.6 on 7/5  Cont to monitor  Labs ordered for Mondayv 17. Leukocytosis  WBCs 12.4 on 7/5  UA ?+, Ucx NG  Labs ordered for Monday 18. Elevated blood pressure  This AM, otherwise controlled  Cont to monitor   LOS (Days) 2 A FACE TO FACE EVALUATION WAS PERFORMED  Ricardo Friedman Karis Juba 04/07/2017 8:07 AM

## 2017-04-07 NOTE — Progress Notes (Signed)
Occupational Therapy Session Note  Patient Details  Name: Ricardo Friedman MRN: [id number] Date of Birth: August 10, 1971  Today's Date: 04/07/2017 OT Individual Time: 1000-1100 OT Individual Time Calculation (min): 60 min    Short Term Goals: Week 1:  OT Short Term Goal 1 (Week 1): STGs equal to LTGs based on ELOS  Skilled Therapeutic Interventions/Progress Updates:    Pt seen this session to address equipment changes to enable pt to transfer more easily to [facility] and propel chair.  Pt initially transferred to his L to [facility] but lost his balance as he was leaning so far to the L and needed mod A to recover.  Repeated transfer with BSC lowered 2 notches and pt was able to transfer to his L and R with touching A and cues for head hip orientation so he is not fully leaning to his L.   Pt wt shifted well to enable therapist to assist him with clothing management.   W/c cushion changed to a standard cushion which seated him lower so his L foot could more easily touch the ground.  He needed a shoe donned on foot. Pt was then able to self propel chair with L foot and hand.  Pt propelled to the gym and back about 200 ft.    The R hand splint is now too large and constantly sliding off hand.  He has full PROM, no edema or tone.  Will have pt not use splint for the meantime.  Discussed other options of slings as the current arm sling is putting pressure on his neck when he is transferring.    PROM exercises for LUE including passive.scapular gliding exercises.  Pt returned to room with all needs met.  Therapy Documentation Precautions:  Precautions Precautions: Fall Required Braces or Orthoses: Sling, Cervical Brace Cervical Brace: At all times Restrictions Weight Bearing Restrictions: Yes RUE Weight Bearing: Weight bearing as tolerated (Shoulder imobilizer for comfort.) RLE Weight Bearing: Weight bearing as tolerated (For transfers only) LLE Weight Bearing: Non weight bearing Other Position/Activity  Restrictions: LLE WBAT for transfers only    Vital Signs: Therapy Vitals Temp: 99.2 F (37.3 C) Temp Source: Oral Pulse Rate: 73 Resp: 16 BP: (!) 158/80 Patient Position (if appropriate): Lying Oxygen Therapy SpO2: 96 % O2 Device: Not Delivered Pain: Pain Assessment Pain Score: 4  ADL:   See Function Navigator for Current Functional Status.   Therapy/Group: Individual Therapy  Jenks 04/07/2017, 7:59 AM

## 2017-04-07 NOTE — Progress Notes (Signed)
ANTICOAGULATION CONSULT NOTE - Follow Up Consult  Pharmacy Consult for Coumadin Indication: VTE prophylaxis  No Known Allergies  Patient Measurements: Height: 5\' 10"  (177.8 cm) Weight: 187 lb 11.2 oz (85.1 kg) IBW/kg (Calculated) : 73   Vital Signs: Temp: 99.2 F (37.3 C) (07/06 0435) Temp Source: Oral (07/06 0435) BP: 158/80 (07/06 0435) Pulse Rate: 73 (07/06 0435)  Labs:  Recent Labs  04/05/17 0414 04/06/17 0636 04/07/17 0618  HGB 12.0* 11.6*  --   HCT 35.2* 34.2*  --   PLT 267 303  --   LABPROT 16.9* 20.8* 26.2*  INR 1.36 1.76 2.35  CREATININE 0.67 0.57*  --     Estimated Creatinine Clearance: 119.1 mL/min (A) (by C-G formula based on SCr of 0.57 mg/dL (L)).   Medications:  Scheduled:  . bethanechol  10 mg Oral TID  . enoxaparin (LOVENOX) injection  40 mg Subcutaneous Q24H  . feeding supplement (PRO-STAT SUGAR FREE 64)  30 mL Oral BID  . gabapentin  300 mg Oral TID  . methocarbamol  500 mg Oral TID  . pantoprazole  40 mg Oral Daily   Or  . pantoprazole (PROTONIX) IV  40 mg Intravenous Daily  . senna-docusate  1 tablet Oral BID  . tamsulosin  0.4 mg Oral Daily  . traMADol  50 mg Oral TID  . Warfarin - Pharmacist Dosing Inpatient   Does not apply q1800    Assessment: 46yo on Coumadin for VTE prophylaxis x 8wks, s/p multiple fractures.  INR trending upward.  Hg stable, pltc wnl.  No bleeding noted.  Goal of Therapy:  INR 2-3 Monitor platelets by anticoagulation protocol: Yes   Plan:  Repeat Coumadin 5mg  D/C Lovenox with INR > 2 Daily INR   Alvester MorinKendra Albi Rappaport, B.S., PharmD Clinical Pharmacist  [facility]

## 2017-04-07 NOTE — Progress Notes (Signed)
Physical Therapy Session Note  Patient Details  Name: Ricardo Friedman MRN: [id number] Date of Birth: Dec 07, 1970  Today's Date: 04/07/2017 PT Individual Time: 8841-6606 PT Individual Time Calculation (min): 48 min   Short Term Goals: Week 1:  PT Short Term Goal 1 (Week 1): Max A x1 chair to/from bed transfer  PT Short Term Goal 2 (Week 1): Supervision for R rolling, Mod A for L rolling  PT Short Term Goal 3 (Week 1): Tolerate upright activity for 30+ minutes  PT Short Term Goal 4 (Week 1): Supervision to touching assist for dynamic sitting balance  PT Short Term Goal 5 (Week 1): Perform 50 ft with Mod A in w/c   Skilled Therapeutic Interventions/Progress Updates:   Session 1:   Pt supine in bed upon arrival and agreeable to therapy. Pt requesting use of bedside commode prior to therapy.   Pt transferred supine to sit w/ HOB elevated and L handrail use with Mod A and verbal cues to maintain WB precautions with RUE/LEs. Pt transferred bed to bedside commode w/ Mod A via lateral scoot transfers. Assist for RUE/LE management and he continues to require verbal cues to maintain NWB RLE. Pt maintained static sitting balance on bedside commode w/ feet elevated from floor and stand-by assist, 4-5 minutes. Pt then transferred back to EOB via lateral pivot transfer, Mod A for RLE management and touching/steadying.   Assisted pt in donning shorts and t-shirt while at EOB, Max A. Pt with improved dynamic sitting balance, requiring only Min A to correct postural perturbations.   Pt transferred EOB to w/c, Mod A and performed 150+ ft w/c mobility w/ Max A and LUE use.   Ended session sitting upright in w/c eating breakfast, call bell within reach and all needs met.   Session 2:   Pt supine in bed upon arrival and agreeable to therapy.   Instructed pt on LE strengthening exercises he can perform outside of therapy 1x/hr. Pt performed all exercises correctly and within pain tolerance and avoided AROM  causing sharp pain, pt verbalized understanding and in agreement. Pt given visual handout with pictures via [employer].   -R/L quad sets, set of 10  -R/L ankle pumps, set of 10   -R/L heel slides, set of 5   -R/L knee marches in supine, set of 5   Pt transferred supine to sit w/ unilateral handrail use, Min A. Pt transferred to recliner via scoot pivot, Mod A for RLE management, verbal cues for sequence and to maintain NWB precautions.   Ended session sitting in recliner, call bell within reach, and all needs met.   Therapy Documentation Precautions:  Precautions Precautions: Fall Required Braces or Orthoses: Sling, Cervical Brace Cervical Brace: At all times Restrictions Weight Bearing Restrictions: Yes RUE Weight Bearing: Weight bearing as tolerated (Shoulder imobilizer for comfort.) RLE Weight Bearing: Weight bearing as tolerated (For transfers only) LLE Weight Bearing: Non weight bearing Other Position/Activity Restrictions: LLE WBAT for transfers only  See Function Navigator for Current Functional Status.   Therapy/Group: Individual Therapy  Cylus Douville K Arnette 04/07/2017, 3:27 PM

## 2017-04-07 NOTE — Progress Notes (Signed)
Occupational Therapy Session Note  Patient Details  Name: Ricardo RummageRichard Nero MRN: [id number] Date of Birth: 11/03/1970  Today's Date: 04/07/2017 OT Individual Time: 1300-1345 OT Individual Time Calculation (min): 45 min    Short Term Goals: Week 1:  OT Short Term Goal 1 (Week 1): STGs equal to LTGs based on ELOS  Skilled Therapeutic Interventions/Progress Updates:    Pt seen for OT session focusing on functional mobility, transfers, and education. Pt sitting up in w/c upon arrival, voicing increased stiffness from prolonged time in w/c and desiring to return to bed. He completed min A sliding board transfer w/c> bed with min A and assist to place and remove board. He required min A for return to supine and requesting supine rest break prior to remainder of session. During rest break, discussed OT/ PT goals, ELOS, DME , transfer methods, and continuum of care.  He transferred to EOB with assist to bring trunk upright to prevent weightbearing through R UE. Upon sitting EOB, pt voiced need for urgent BM. Completed scoot pivot without use of board to [facility], lateral leans for total A clothing management. Pt +BM and able to complete buttock hygiene independently. He returned to EOB in same manner as described above. Pt left in side-lying per request at end of session, all needs in reach.   Therapy Documentation Precautions:  Precautions Precautions: Fall Required Braces or Orthoses: Sling, Cervical Brace Cervical Brace: At all times Restrictions Weight Bearing Restrictions: Yes RUE Weight Bearing: Weight bearing as tolerated (Shoulder imobilizer for comfort.) RLE Weight Bearing: Weight bearing as tolerated (For transfers only) LLE Weight Bearing: Non weight bearing Other Position/Activity Restrictions: LLE WBAT for transfers only Pain:   "stiffness" from prolonged time in chair. RN administered scheduled pain and muscle relaxer during session.   See Function Navigator for Current Functional  Status.   Therapy/Group: Individual Therapy  Lewis, Demara Lover C 04/07/2017, 1:13 PM

## 2017-04-08 ENCOUNTER — Inpatient Hospital Stay (HOSPITAL_COMMUNITY): Payer: Commercial Managed Care - PPO | Admitting: Physical Therapy

## 2017-04-08 ENCOUNTER — Inpatient Hospital Stay (HOSPITAL_COMMUNITY): Payer: Commercial Managed Care - PPO | Admitting: Occupational Therapy

## 2017-04-08 DIAGNOSIS — E871 Hypo-osmolality and hyponatremia: Secondary | ICD-10-CM

## 2017-04-08 LAB — PROTIME-INR
INR: 2.18
PROTHROMBIN TIME: 24.7 s — AB (ref 11.4–15.2)

## 2017-04-08 MED ORDER — WARFARIN SODIUM 5 MG PO TABS
5.0000 mg | ORAL_TABLET | Freq: Once | ORAL | Status: AC
  Administered 2017-04-08: 5 mg via ORAL
  Filled 2017-04-08: qty 1

## 2017-04-08 MED ORDER — ACETAMINOPHEN 500 MG PO TABS
500.0000 mg | ORAL_TABLET | ORAL | Status: DC | PRN
  Administered 2017-04-08 – 2017-04-14 (×3): 500 mg via ORAL
  Filled 2017-04-08 (×3): qty 1

## 2017-04-08 NOTE — Progress Notes (Signed)
Occupational Therapy Session Note  Patient Details  Name: Annamary RummageRichard Ow MRN: [id number] Date of Birth: 01/03/1971  Today's Date: 04/08/2017 OT Individual Time: 1000-1058 and 1400-1441 OT Individual Time Calculation (min): 58 min and 41 min   Short Term Goals: Week 1:  OT Short Term Goal 1 (Week 1): STGs equal to LTGs based on ELOS  Skilled Therapeutic Interventions/Progress Updates:    Session 1: Upon entering the room, pt seated in wheelchair awaiting therapist with 5/10 c/o headache. RN notified and medications given this session. Pt propelled wheelchair to ADL apartment with supervision. OT educated and demonstrated use of reacher and sock aid to don/doff B socks this session. Pt returned demonstration with min verbal cues for technique.OT also discussed other uses for reacher in regards to dressing. Education to continue.  Pt propelled wheelchair to dayroom for slide board transfer into recliner chair similar to home environment. Pt required min verbal cues for set up and management of wheelchair and slide board. Pt transferred to the L with min A and transferred to the R back towards wheelchair with min A uphill on uneven surface. Pt propelled wheelchair 150' back to room with increased time and performed lateral scoot transfer from wheelchair >bed with min A. Call bell and all needed items within reach upon exiting the room.  Session 2: Upon entering the room, pt supine in bed sleeping soundly. He is agreeable to OT intervention this session. Lateral scoot from bed >wheelchair with supervision and set up. Pt's 5 children arrived for observation this session. Pt requesting to go outside. OT assisted pt outside for time management. Pt propelled wheelchair on uneven surfaces with supervision. OT provided education for pt and family regarding current OT goals and d/c planning. Pt returned to room and propelled self 400' with hemiplegic technique. Pt returned to room at end of session with family  members.   Therapy Documentation Precautions:  Precautions Precautions: Fall Required Braces or Orthoses: Sling, Cervical Brace Cervical Brace: At all times Restrictions Weight Bearing Restrictions: Yes RUE Weight Bearing: Weight bearing as tolerated RLE Weight Bearing: Weight bearing as tolerated LLE Weight Bearing: Non weight bearing Other Position/Activity Restrictions: LLE WBAT for transfers only General:   Vital Signs:   Pain: Pain Assessment Pain Assessment: 0-10 Pain Score: 5  Pain Type: Acute pain Pain Location: Back Pain Orientation: Lower Pain Descriptors / Indicators: Aching Pain Frequency: Constant Pain Onset: On-going Patients Stated Pain Goal: 0 Pain Intervention(s): Medication (See eMAR) Multiple Pain Sites: Yes  See Function Navigator for Current Functional Status.   Therapy/Group: Individual Therapy  Alen BleacherBradsher, Calix Heinbaugh P 04/08/2017, 12:29 PM

## 2017-04-08 NOTE — Progress Notes (Signed)
Physical Therapy Session Note  Patient Details  Name: Ricardo RummageRichard Friedman MRN: [id number] Date of Birth: 04/12/1971  Today's Date: 04/08/2017 PT Individual Time: 1500-1530 PT Individual Time Calculation (min): 30 min   Short Term Goals: Week 1:  PT Short Term Goal 1 (Week 1): Pt will perform chair to/from bed transfer, Mod A x1 PT Short Term Goal 2 (Week 1): Pt will be Mod A for rolling R/L PT Short Term Goal 3 (Week 1): Pt will tolerate upright activity for 30+ minutes  PT Short Term Goal 4 (Week 1): Pt will maintain dynamic sitting balance with supervision  PT Short Term Goal 5 (Week 1): Pt will perform 50 ft with Mod A in w/c   Skilled Therapeutic Interventions/Progress Updates:    Pt c/o of pain in L gluteal region upon lying supine on mat table. PT treatment session focused on LE strengthening and transfers.  Pt sitting in w/c upon arrival with family present and pt agreeable to PT treatment session. Pt transferred w/c to mat squat pivot to the L with steadying assist. Pt sit to supine with increased time with verbal and tactile cues for technique and sequencing. PT reviewed LE exercises provided by previous therapist and completed 10 of each with B LEs: APs, quad sets, heel slides and supine marches for LE strengthening. PT added SLRs to the pt's list of exercises and provided a printout for reference. Pt supine to sit with min A with verbal and tactile cues for pushing up with the L UE. PT transferred w/c to mat to the R with steadying assist. Pt performed w/c mobility from the therapy gym to the room with supervision. Pt left in w/c with call bell in place and family present.   Therapy Documentation Precautions:  Precautions Precautions: Fall Required Braces or Orthoses: Sling, Cervical Brace Cervical Brace: At all times Restrictions Weight Bearing Restrictions: Yes RUE Weight Bearing: Weight bearing as tolerated RLE Weight Bearing: Weight bearing as tolerated LLE Weight Bearing: Non  weight bearing Other Position/Activity Restrictions: LLE WBAT for transfers only   See Function Navigator for Current Functional Status.   Therapy/Group: Individual Therapy  Ricardo Friedman 04/08/2017, 4:21 PM

## 2017-04-08 NOTE — Progress Notes (Signed)
Ricardo Friedman PHYSICAL MEDICINE & REHABILITATION     PROGRESS NOTE  Subjective/Complaints:  No new issues this morning. Pain getting better. Able to move more also  ROS: pt denies nausea, vomiting, diarrhea, cough, shortness of breath or chest pain     Objective: Vital Signs: Blood pressure 138/72, pulse 97, temperature 98.5 F (36.9 C), temperature source Oral, resp. rate 17, height 5\' 10"  (1.778 m), weight 85.1 kg (187 lb 11.2 oz), SpO2 96 %. No results found.  Recent Labs  04/06/17 0636  WBC 12.4*  HGB 11.6*  HCT 34.2*  PLT 303    Recent Labs  04/06/17 0636  NA 129*  K 3.8  CL 96*  GLUCOSE 122*  BUN 12  CREATININE 0.57*  CALCIUM 8.6*   CBG (last 3)  No results for input(s): GLUCAP in the last 72 hours.  Wt Readings from Last 3 Encounters:  04/05/17 85.1 kg (187 lb 11.2 oz)  03/30/17 95.5 kg (210 lb 8.6 oz)    Physical Exam:  BP 138/72 (BP Location: Left Arm)   Pulse 97   Temp 98.5 F (36.9 C) (Oral)   Resp 17   Ht 5\' 10"  (1.778 m)   Wt 85.1 kg (187 lb 11.2 oz)   SpO2 96%   BMI 26.93 kg/m  Constitutional: He appears well-developed. NAD.  HENT: Normocephalic. Healing abrasions to the forehead Eyes: EOMare normal. No discharge.   Neck: Cervical collar in place Cardiovascular: RRR without murmur. No JVD . Respiratory: Effort normal and breath sounds normal.  GI: Soft. Bowel sounds are normal.  Musculoskeletal: He exhibits edema and tenderness persistent Neurological: He is alertand oriented.  Motor: RUE: Shoulder abduction 1+/5, distally 0/5  Sensation absent to light touch in RUE LUE: 5/5 proximal to distal B/l LE: HF 2/5, KE 2+/5, ADF/PF 4/5 (pain inhibition, stable).  Psychiatric: Normal mood and behavior Skin. Left lower extremity wound dressed. Pelvic wound clean and intact  Assessment/Plan: 1. Functional deficits secondary to polytrauma which require 3+ hours per day of interdisciplinary therapy in a comprehensive inpatient rehab  setting. Physiatrist is providing close team supervision and 24 hour management of active medical problems listed below. Physiatrist and rehab team continue to assess barriers to discharge/monitor patient progress toward functional and medical goals.  Function:  Bathing Bathing position   Position: Bed  Bathing parts Body parts bathed by patient: Right arm, Chest, Abdomen, Front perineal area, Right upper leg, Left upper leg Body parts bathed by helper: Right lower leg, Left lower leg, Buttocks, Left arm, Back  Bathing assist        Upper Body Dressing/Undressing Upper body dressing   What is the patient wearing?: Pull over shirt/dress     Pull over shirt/dress - Perfomed by patient: Thread/unthread right sleeve, Thread/unthread left sleeve Pull over shirt/dress - Perfomed by helper: Put head through opening, Pull shirt over trunk        Upper body assist        Lower Body Dressing/Undressing Lower body dressing   What is the patient wearing?: Pants, Non-skid slipper socks       Pants- Performed by helper: Thread/unthread right pants leg, Thread/unthread left pants leg, Pull pants up/down   Non-skid slipper socks- Performed by helper: Don/doff right sock, Don/doff left sock                  Lower body assist        Toileting Toileting   Toileting steps completed by patient: Performs perineal hygiene  Toileting steps completed by helper: Adjust clothing after toileting, Adjust clothing prior to toileting    Toileting assist Assist level: Touching or steadying assistance (Pt.75%)   Transfers Chair/bed transfer   Chair/bed transfer method: Lateral scoot Chair/bed transfer assist level: Moderate assist (Pt 50 - 74%/lift or lower) Chair/bed transfer assistive device: Armrests     Locomotion Ambulation Ambulation activity did not occur: Safety/medical concerns         Wheelchair   Type: Manual Max wheelchair distance: 150 Assist Level: Maximal assistance  (Pt 25 - 49%)  Cognition Comprehension Comprehension assist level: Follows complex conversation/direction with no assist  Expression Expression assist level: Expresses complex ideas: With no assist  Social Interaction Social Interaction assist level: Interacts appropriately with others - No medications needed.  Problem Solving Problem solving assist level: Solves complex problems: Recognizes & self-corrects  Memory Memory assist level: Complete Independence: No helper    Medical Problem List and Plan: 1. Decreased functional mobilitysecondary to anterior inferior left C6 vertebral corner fracture as well as widening of C6-7 facet joint with capsule disruption and ligamentous injury, C8 and T1 nerve root avulsion with right brachial plexus injury, cervical collar at all times, pelvic ring fracture with zone 3 fracture, right SI diastasis as well as left open fibular fracture status post ORIF anterior entrance sacral screw for sacral fracture. Weightbearing as tolerated left lower extremity for transfers only nonweightbearing right lower extremity.  Cont CIR  I  2. DVT Prophylaxis/Anticoagulation: Coumadin for DVT prophylaxis.therapeutic  Vascular studies neg 3. Pain Management: Ultram 50 mg 3 times a day and oxycodone as needed  Gabapentin 300 TID started 7/5  Robaxin 500 TID started 7/6 4. Mood: Provide emotional support 5. Neuropsych: This patient iscapable of making decisions on hisown behalf. 6. Skin/Wound Care: Skin care as directed 7. 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 Contact precautions 9.Urinary retention. Urecholine 10 mg 3 times a day as well as Flomax 0.4 mg daily.   PVR with some retention, cont meds  Foley d/ced 10.Constipation.Laxative assistance 11.Tobacco abuse. Counseling 12. Hyperglycemia  Likely stress induced 13. Hyponatremia  Na+ 129 on 7/5  Cont to monitor  Labs ordered for Monday 14. Transaminitis  Cont to  follow 15. Hypoalbuminemia  Supplement initiated 7/5 16. ABLA  Hb 11.6 on 7/5  Cont to monitor  Labs ordered for Mondayv 17. Leukocytosis  WBCs 12.4 on 7/5  UA ?+, Ucx NG  Labs ordered for Monday 18. Elevated blood pressure  Improving control   LOS (Days) 3 A FACE TO FACE EVALUATION WAS PERFORMED  Khadim Lundberg T 04/08/2017 9:34 AM

## 2017-04-08 NOTE — Progress Notes (Signed)
Physical Therapy Session Note  Patient Details  Name: Ricardo Friedman MRN: [id number] Date of Birth: [birth year]  Today's Date: 04/08/2017 PT Individual Time: 0830-0930 PT Individual Time Calculation (min): 60 min   Short Term Goals: Week 1:  PT Short Term Goal 1 (Week 1): Pt will perform chair to/from bed transfer, Mod A x1 PT Short Term Goal 2 (Week 1): Pt will be Mod A for rolling R/L PT Short Term Goal 3 (Week 1): Pt will tolerate upright activity for 30+ minutes  PT Short Term Goal 4 (Week 1): Pt will maintain dynamic sitting balance with supervision  PT Short Term Goal 5 (Week 1): Pt will perform 50 ft with Mod A in w/c   Skilled Therapeutic Interventions/Progress Updates:    reports pain is "okay" and denies need for medication.  Session focus on activity tolerance and functional mobility.    Pt transition supine to sit with increased time using bed rails and supervision for safety.  Pt performs lateral scoot to and from w/c on level surfaces to R side throughout session with close supervision and therapist steadying w/c. W/c propulsion throughout unit, max distance 300', with L hemi technique for mobility and activity tolerance.  PT instructed pt in car transfer at simulated low sports car height with overall steady assist for squat/pivot and min assist to lift LLE into car.  Discussed options for transfer into actual vehicle if much different from simulated car.  Pt performs transfer to low recliner in dayroom to simulate home chair with supervision in to recliner on R and min assist to w/c on L.  Pt performs dynamic sitting balance activities on firm surface with foot support progress to compliant surface (dynadisk) without LE support focus on weight shifting and reaching outside BOS with return to midline while maintaining RLE NWB.  Pt returned to room at end of session and positioned in w/c with call bell in reach and needs met.    Therapy Documentation Precautions:   Precautions Precautions: Fall Required Braces or Orthoses: Sling, Cervical Brace Cervical Brace: At all times Restrictions Weight Bearing Restrictions: Yes RUE Weight Bearing: Weight bearing as tolerated RLE Weight Bearing: Weight bearing as tolerated LLE Weight Bearing: Non weight bearing Other Position/Activity Restrictions: LLE WBAT for transfers only   See Function Navigator for Current Functional Status.   Therapy/Group: Individual Therapy  Yunuen Mordan E Penven-Crew 04/08/2017, 10:21 AM

## 2017-04-08 NOTE — Progress Notes (Signed)
ANTICOAGULATION CONSULT NOTE - Follow Up Consult  Pharmacy Consult for Coumadin Indication: VTE prophylaxis  No Known Allergies  Patient Measurements: Height: 5\' 10"  (177.8 cm) Weight: 187 lb 11.2 oz (85.1 kg) IBW/kg (Calculated) : 73   Vital Signs: Temp: 98.5 F (36.9 C) (07/07 0522) Temp Source: Oral (07/07 0522) BP: 138/72 (07/07 0522) Pulse Rate: 97 (07/07 0522)  Labs:  Recent Labs  04/06/17 0636 04/07/17 0618 04/08/17 0443  HGB 11.6*  --   --   HCT 34.2*  --   --   PLT 303  --   --   LABPROT 20.8* 26.2* 24.7*  INR 1.76 2.35 2.18  CREATININE 0.57*  --   --     Estimated Creatinine Clearance: 119.1 mL/min (A) (by C-G formula based on SCr of 0.57 mg/dL (L)).   Medications:  Scheduled:  . bethanechol  10 mg Oral TID  . feeding supplement (PRO-STAT SUGAR FREE 64)  30 mL Oral BID  . gabapentin  300 mg Oral TID  . methocarbamol  500 mg Oral TID  . pantoprazole  40 mg Oral Daily   Or  . pantoprazole (PROTONIX) IV  40 mg Intravenous Daily  . senna-docusate  1 tablet Oral BID  . tamsulosin  0.4 mg Oral Daily  . traMADol  50 mg Oral TID  . Warfarin - Pharmacist Dosing Inpatient   Does not apply q1800    Assessment: 46yo on Coumadin for VTE prophylaxis x 8wks, s/p multiple fractures.  INR down some today but still therapeutic. Only 20-75% intake recorded (varies per meal).  Hg stable, pltc wnl.  No bleeding noted.  Goal of Therapy:  INR 2-3 Monitor platelets by anticoagulation protocol: Yes   Plan:  Repeat Coumadin 5mg  D/C Lovenox with INR > 2 Daily INR  Ricardo SnufferJessica Malva Friedman, PharmD, BCPS Clinical Pharmacist Clinical Phone 04/08/2017 until 3:30 PM - #[phone] After hours, please call [phone]

## 2017-04-09 LAB — PROTIME-INR
INR: 2.35
Prothrombin Time: 26.1 seconds — ABNORMAL HIGH (ref 11.4–15.2)

## 2017-04-09 MED ORDER — GABAPENTIN 800 MG PO TABS
400.0000 mg | ORAL_TABLET | Freq: Three times a day (TID) | ORAL | Status: DC
  Filled 2017-04-09: qty 0.5

## 2017-04-09 MED ORDER — GABAPENTIN 400 MG PO CAPS
400.0000 mg | ORAL_CAPSULE | Freq: Three times a day (TID) | ORAL | Status: DC
  Administered 2017-04-09 – 2017-04-15 (×19): 400 mg via ORAL
  Filled 2017-04-09 (×19): qty 1

## 2017-04-09 MED ORDER — WARFARIN SODIUM 5 MG PO TABS
5.0000 mg | ORAL_TABLET | Freq: Every day | ORAL | Status: DC
  Administered 2017-04-09 – 2017-04-10 (×2): 5 mg via ORAL
  Filled 2017-04-09 (×3): qty 1

## 2017-04-09 NOTE — Progress Notes (Signed)
ANTICOAGULATION CONSULT NOTE - Follow Up Consult  Pharmacy Consult for Coumadin Indication: VTE prophylaxis  No Known Allergies  Labs:  Recent Labs  04/07/17 0618 04/08/17 0443 04/09/17 0519  LABPROT 26.2* 24.7* 26.1*  INR 2.35 2.18 2.35    Estimated Creatinine Clearance: 119.1 mL/min (A) (by C-G formula based on SCr of 0.57 mg/dL (L)).   Assessment: 46yo on Coumadin for VTE prophylaxis x 8wks, s/p multiple fractures.  INR stable. Intake appears to be picking up.  Hg stable, pltc wnl.  No bleeding noted.  Goal of Therapy:  INR 2-3 Monitor platelets by anticoagulation protocol: Yes   Plan:  Will try 5mg  daily If INR stable in am consider MWF INR checks  Ricardo Friedman PharmD., BCPS Clinical Pharmacist Pager [phone] 04/09/2017 2:02 PM

## 2017-04-09 NOTE — Progress Notes (Signed)
[place] PHYSICAL MEDICINE & REHABILITATION     PROGRESS NOTE  Subjective/Complaints:  Having right arm pain, mild to moderate in severity. Able to sleep  ROS: pt denies nausea, vomiting, diarrhea, cough, shortness of breath or chest pain    Objective: Vital Signs: Blood pressure 120/67, pulse 93, temperature 100 F (37.8 C), temperature source Oral, resp. rate (!) 22, height 5\' 10"  (1.778 m), weight 85.1 kg (187 lb 11.2 oz), SpO2 98 %. No results found. No results for input(s): WBC, HGB, HCT, PLT in the last 72 hours. No results for input(s): NA, K, CL, GLUCOSE, BUN, CREATININE, CALCIUM in the last 72 hours.  Invalid input(s): CO CBG (last 3)  No results for input(s): GLUCAP in the last 72 hours.  Wt Readings from Last 3 Encounters:  04/05/17 85.1 kg (187 lb 11.2 oz)  03/30/17 95.5 kg (210 lb 8.6 oz)    Physical Exam:  BP 120/67 (BP Location: Right Arm)   Pulse 93   Temp 100 F (37.8 C) (Oral) Comment: rn notified   Resp (!) 22 Comment: rn notified  Ht 5\' 10"  (1.778 m)   Wt 85.1 kg (187 lb 11.2 oz)   SpO2 98%   BMI 26.93 kg/m  Constitutional: He appears well-developed. NAD.  HENT: Normocephalic. Healing abrasions to the forehead Eyes: EOMare normal. No discharge.   Neck: Cervical collar in place Cardiovascular: RRR without murmur. No JVD  . Respiratory: CTA Bilaterally without wheezes or rales. Normal effort  GI: Soft. Bowel sounds are normal.  Musculoskeletal: He exhibits edema and tenderness persistent. Minimal pain with ROM RUE Neurological: He is alertand oriented.  Motor: RUE: Shoulder abduction 1+/5, distally 0/5  Sensation absent to light touch in RUE, no dysesthesias  LUE: 5/5 proximal to distal B/l LE: HF 2/5, KE 2+/5, ADF/PF 4/5 (pain inhibition, stable).  Psychiatric: Normal mood and behavior Skin. Left lower extremity wound dressed. Pelvic wound clean and intact  Assessment/Plan: 1. Functional deficits secondary to polytrauma which require 3+  hours per day of interdisciplinary therapy in a comprehensive inpatient rehab setting. Physiatrist is providing close team supervision and 24 hour management of active medical problems listed below. Physiatrist and rehab team continue to assess barriers to discharge/monitor patient progress toward functional and medical goals.  Function:  Bathing Bathing position   Position: Bed  Bathing parts Body parts bathed by patient: Right arm, Chest, Abdomen, Front perineal area, Right upper leg, Left upper leg Body parts bathed by helper: Right lower leg, Left lower leg, Buttocks, Left arm, Back  Bathing assist        Upper Body Dressing/Undressing Upper body dressing   What is the patient wearing?: Pull over shirt/dress     Pull over shirt/dress - Perfomed by patient: Thread/unthread right sleeve, Thread/unthread left sleeve Pull over shirt/dress - Perfomed by helper: Put head through opening, Pull shirt over trunk        Upper body assist        Lower Body Dressing/Undressing Lower body dressing   What is the patient wearing?: Non-skid slipper socks       Pants- Performed by helper: Thread/unthread right pants leg, Thread/unthread left pants leg, Pull pants up/down Non-skid slipper socks- Performed by patient: Don/doff right sock Non-skid slipper socks- Performed by helper: Don/doff right sock, Don/doff left sock                  Lower body assist Assist for lower body dressing: Set up, Supervision or verbal cues, Assistive device  Assistive Device Comment: sock aid and Chief of Staff   Toileting steps completed by patient: Performs perineal hygiene Toileting steps completed by helper: Adjust clothing after toileting, Adjust clothing prior to toileting    Toileting assist Assist level: Touching or steadying assistance (Pt.75%)   Transfers Chair/bed transfer   Chair/bed transfer method: Lateral scoot Chair/bed transfer assist level: Touching or  steadying assistance (Pt > 75%) Chair/bed transfer assistive device: Armrests     Locomotion Ambulation Ambulation activity did not occur: Safety/medical concerns         Wheelchair   Type: Manual Max wheelchair distance: 400' Assist Level: Supervision or verbal cues  Cognition Comprehension Comprehension assist level: Follows complex conversation/direction with no assist  Expression Expression assist level: Expresses complex ideas: With no assist  Social Interaction Social Interaction assist level: Interacts appropriately with others - No medications needed.  Problem Solving Problem solving assist level: Solves complex problems: Recognizes & self-corrects  Memory Memory assist level: Complete Independence: No helper    Medical Problem List and Plan: 1. Decreased functional mobilitysecondary to anterior inferior left C6 vertebral corner fracture as well as widening of C6-7 facet joint with capsule disruption and ligamentous injury, C8 and T1 nerve root avulsion with right brachial plexus injury, cervical collar at all times, pelvic ring fracture with zone 3 fracture, right SI diastasis as well as left open fibular fracture status post ORIF anterior entrance sacral screw for sacral fracture. Weightbearing as tolerated left lower extremity for transfers only nonweightbearing right lower extremity.  Cont CIR    2. DVT Prophylaxis/Anticoagulation: Coumadin for DVT prophylaxis.therapeutic  Vascular studies neg 3. Pain Management: Ultram 50 mg 3 times a day and oxycodone as needed  Gabapentin 300 TID started 7/5---increase to 400mg  tid 7/8  Robaxin 500 TID started 7/6 4. Mood: Provide emotional support 5. Neuropsych: This patient iscapable of making decisions on hisown behalf. 6. Skin/Wound Care: Skin care as directed 7. 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 Contact precautions 9.Urinary retention. Urecholine 10 mg 3 times a day as well as  Flomax 0.4 mg daily.   PVR with some retention, cont meds  Foley d/ced 10.Constipation.Laxative assistance 11.Tobacco abuse. Counseling 12. Hyperglycemia  Likely stress induced 13. Hyponatremia  Na+ 129 on 7/5  Cont to monitor  Labs ordered for Monday 14. Transaminitis  Cont to follow 15. Hypoalbuminemia  Supplement initiated 7/5 16. ABLA  Hb 11.6 on 7/5  Cont to monitor  Labs ordered for Mondayv 17. Leukocytosis  WBCs 12.4 on 7/5  UA ?+, Ucx NG  Labs ordered for Monday 18. Elevated blood pressure  Much improved   LOS (Days) 4 A FACE TO FACE EVALUATION WAS PERFORMED  Tamsin Nader T 04/09/2017 7:35 AM

## 2017-04-10 ENCOUNTER — Inpatient Hospital Stay (HOSPITAL_COMMUNITY): Payer: Commercial Managed Care - PPO

## 2017-04-10 ENCOUNTER — Inpatient Hospital Stay (HOSPITAL_COMMUNITY): Payer: Commercial Managed Care - PPO | Admitting: Occupational Therapy

## 2017-04-10 LAB — CBC WITH DIFFERENTIAL/PLATELET
BASOS ABS: 0 10*3/uL (ref 0.0–0.1)
Basophils Relative: 0 %
Eosinophils Absolute: 0.2 10*3/uL (ref 0.0–0.7)
Eosinophils Relative: 1 %
HEMATOCRIT: 31.4 % — AB (ref 39.0–52.0)
Hemoglobin: 10.7 g/dL — ABNORMAL LOW (ref 13.0–17.0)
LYMPHS PCT: 10 %
Lymphs Abs: 2.2 10*3/uL (ref 0.7–4.0)
MCH: 30.7 pg (ref 26.0–34.0)
MCHC: 34.1 g/dL (ref 30.0–36.0)
MCV: 90 fL (ref 78.0–100.0)
MONOS PCT: 9 %
Monocytes Absolute: 2 10*3/uL — ABNORMAL HIGH (ref 0.1–1.0)
Neutro Abs: 17.3 10*3/uL — ABNORMAL HIGH (ref 1.7–7.7)
Neutrophils Relative %: 80 %
Platelets: 445 10*3/uL — ABNORMAL HIGH (ref 150–400)
RBC: 3.49 MIL/uL — AB (ref 4.22–5.81)
RDW: 14.2 % (ref 11.5–15.5)
WBC: 21.7 10*3/uL — AB (ref 4.0–10.5)

## 2017-04-10 LAB — COMPREHENSIVE METABOLIC PANEL
ALBUMIN: 3.1 g/dL — AB (ref 3.5–5.0)
ALT: 107 U/L — ABNORMAL HIGH (ref 17–63)
AST: 83 U/L — AB (ref 15–41)
Alkaline Phosphatase: 116 U/L (ref 38–126)
Anion gap: 9 (ref 5–15)
BILIRUBIN TOTAL: 1.2 mg/dL (ref 0.3–1.2)
BUN: 9 mg/dL (ref 6–20)
CALCIUM: 8.7 mg/dL — AB (ref 8.9–10.3)
CO2: 23 mmol/L (ref 22–32)
Chloride: 99 mmol/L — ABNORMAL LOW (ref 101–111)
Creatinine, Ser: 0.58 mg/dL — ABNORMAL LOW (ref 0.61–1.24)
GFR calc Af Amer: >60 mL/min (ref >60)
GFR calc non Af Amer: >60 mL/min (ref >60)
GLUCOSE: 113 mg/dL — AB (ref 65–99)
Potassium: 3.5 mmol/L (ref 3.5–5.1)
Sodium: 131 mmol/L — ABNORMAL LOW (ref 135–145)
TOTAL PROTEIN: 6.7 g/dL (ref 6.5–8.1)

## 2017-04-10 LAB — URINALYSIS, COMPLETE (UACMP) WITH MICROSCOPIC
BILIRUBIN URINE: NEGATIVE
Glucose, UA: NEGATIVE mg/dL
Ketones, ur: NEGATIVE mg/dL
Nitrite: NEGATIVE
PROTEIN: NEGATIVE mg/dL
SPECIFIC GRAVITY, URINE: 1.008 (ref 1.005–1.030)
SQUAMOUS EPITHELIAL / LPF: NONE SEEN
pH: 5 (ref 5.0–8.0)

## 2017-04-10 LAB — PROTIME-INR
INR: 2.22
Prothrombin Time: 25 seconds — ABNORMAL HIGH (ref 11.4–15.2)

## 2017-04-10 NOTE — Progress Notes (Signed)
[place] PHYSICAL MEDICINE & REHABILITATION     PROGRESS NOTE  Subjective/Complaints:  Seen sitting up in his char this AM.  He slept fairly and has questions about discharge information.   ROS: Denies nausea, vomiting, diarrhea, shortness of breath or chest pain   Objective: Vital Signs: Blood pressure 120/70, pulse 85, temperature 99.8 F (37.7 C), temperature source Oral, resp. rate 18, height 5\' 10"  (1.778 m), weight 85.1 kg (187 lb 11.2 oz), SpO2 95 %. No results found.  Recent Labs  04/10/17 0421  WBC 21.7*  HGB 10.7*  HCT 31.4*  PLT 445*    Recent Labs  04/10/17 0421  NA 131*  K 3.5  CL 99*  GLUCOSE 113*  BUN 9  CREATININE 0.58*  CALCIUM 8.7*   CBG (last 3)  No results for input(s): GLUCAP in the last 72 hours.  Wt Readings from Last 3 Encounters:  04/05/17 85.1 kg (187 lb 11.2 oz)  03/30/17 95.5 kg (210 lb 8.6 oz)    Physical Exam:  BP 120/70 (BP Location: Left Arm)   Pulse 85   Temp 99.8 F (37.7 C) (Oral)   Resp 18   Ht 5\' 10"  (1.778 m)   Wt 85.1 kg (187 lb 11.2 oz)   SpO2 95%   BMI 26.93 kg/m  Constitutional: He appears well-developed. NAD.  HENT: Normocephalic. Healing abrasions to the forehead Eyes: EOMare normal. No discharge.   Neck: Cervical collar in place Cardiovascular: RRR. No JVD  . Respiratory: CTA. Normal effort  GI: Soft. Bowel sounds are normal.  Musculoskeletal: He exhibits edema and tenderness persistent.  Neurological: He is alertand oriented.  Motor: RUE: Shoulder abduction 1+/5, distally 0/5  Sensation absent to light touch in RUE  LUE: 5/5 proximal to distal B/l LE: HF 2/5, KE 2+/5, ADF/PF 4/5 (pain inhibition, unchanged).  Psychiatric: Normal mood and behavior Skin. Left lower extremity wound dressed. Pelvic wound clean and intact  Assessment/Plan: 1. Functional deficits secondary to polytrauma which require 3+ hours per day of interdisciplinary therapy in a comprehensive inpatient rehab setting. Physiatrist  is providing close team supervision and 24 hour management of active medical problems listed below. Physiatrist and rehab team continue to assess barriers to discharge/monitor patient progress toward functional and medical goals.  Function:  Bathing Bathing position   Position: Bed  Bathing parts Body parts bathed by patient: Right arm, Chest, Abdomen, Front perineal area, Right upper leg, Left upper leg Body parts bathed by helper: Right lower leg, Left lower leg, Buttocks, Left arm, Back  Bathing assist        Upper Body Dressing/Undressing Upper body dressing   What is the patient wearing?: Pull over shirt/dress     Pull over shirt/dress - Perfomed by patient: Thread/unthread right sleeve, Thread/unthread left sleeve Pull over shirt/dress - Perfomed by helper: Put head through opening, Pull shirt over trunk        Upper body assist        Lower Body Dressing/Undressing Lower body dressing   What is the patient wearing?: Non-skid slipper socks       Pants- Performed by helper: Thread/unthread right pants leg, Thread/unthread left pants leg, Pull pants up/down Non-skid slipper socks- Performed by patient: Don/doff right sock Non-skid slipper socks- Performed by helper: Don/doff right sock, Don/doff left sock                  Lower body assist Assist for lower body dressing: Set up, Supervision or verbal cues, Assistive device  Assistive Device Comment: sock aid and Chief of Staffreacher    Toileting Toileting   Toileting steps completed by patient: Adjust clothing prior to toileting, Performs perineal hygiene, Adjust clothing after toileting Toileting steps completed by helper: Adjust clothing prior to toileting Toileting Assistive Devices: Grab bar or rail  Toileting assist Assist level: Supervision or verbal cues   Transfers Chair/bed transfer   Chair/bed transfer method: Lateral scoot Chair/bed transfer assist level: Touching or steadying assistance (Pt > 75%) Chair/bed  transfer assistive device: Armrests     Locomotion Ambulation Ambulation activity did not occur: Safety/medical concerns         Wheelchair   Type: Manual Max wheelchair distance: 400' Assist Level: Supervision or verbal cues  Cognition Comprehension Comprehension assist level: Follows complex conversation/direction with no assist  Expression Expression assist level: Expresses complex ideas: With no assist  Social Interaction Social Interaction assist level: Interacts appropriately with others - No medications needed.  Problem Solving Problem solving assist level: Solves complex problems: Recognizes & self-corrects  Memory Memory assist level: Complete Independence: No helper    Medical Problem List and Plan: 1. Decreased functional mobilitysecondary to anterior inferior left C6 vertebral corner fracture as well as widening of C6-7 facet joint with capsule disruption and ligamentous injury, C8 and T1 nerve root avulsion with right brachial plexus injury, cervical collar at all times, pelvic ring fracture with zone 3 fracture, right SI diastasis as well as left open fibular fracture status post ORIF anterior entrance sacral screw for sacral fracture. Weightbearing as tolerated left lower extremity for transfers only nonweightbearing right lower extremity.  Cont CIR   2. DVT Prophylaxis/Anticoagulation: Coumadin for DVT prophylaxis.  Vascular studies neg  INR therpauetic 7/9 3. Pain Management: Ultram 50 mg 3 times a day and oxycodone as needed  Gabapentin 300 TID started 7/5, increases to 400mg  tid 7/9  Robaxin 500 TID started 7/6 4. Mood: Provide emotional support 5. Neuropsych: This patient iscapable of making decisions on hisown behalf. 6. Skin/Wound Care: Skin care as directed 7. 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 Contact precautions 9.Urinary retention. Urecholine 10 mg 3 times a day as well as Flomax 0.4 mg daily.   PVR with some  retention, cont meds  Foley d/ced 10.Constipation.Laxative assistance 11.Tobacco abuse. Counseling 12. Hyperglycemia  Likely stress induced 13. Hyponatremia  Na+ 131 on 7/9  Cont to monitor 14. Transaminitis  Relatively stable 7/9  Cont to follow 15. Hypoalbuminemia  Supplement initiated 7/5 16. ABLA  Hb 10.7 on 7/9  Cont to monitor 17. Leukocytosis  WBCs 21.7 on 7/9  UA ?+, Ucx NG, repeat UA/Ucx ordered  CXR ordered 18. Elevated blood pressure  Controlled 7/9   LOS (Days) 5 A FACE TO FACE EVALUATION WAS PERFORMED  Ankit Karis Jubanil Patel 04/10/2017 8:37 AM

## 2017-04-10 NOTE — Progress Notes (Signed)
Occupational Therapy Session Note  Patient Details  Name: Ricardo Friedman MRN: [id number] Date of Birth: 11-30-1970  Today's Date: 04/10/2017 OT Individual Time: 1330-1430 OT Individual Time Calculation (min): 60 min    Short Term Goals: Week 1:  OT Short Term Goal 1 (Week 1): STGs equal to LTGs based on ELOS  Skilled Therapeutic Interventions/Progress Updates:    OT treatment session focused on R UE NMR, transfer training, and LB dressing techniques. Pt transferred sup<>sit with supervision to advance LEs off of bed and min/Mod A to elevate trunk. Min A to don R bunny arm sling, then pt attempted squat-pivot transfer to R. Pt with lateral LOB to L and unable to correct, landing on the bed. Mod A to return to sitting. Pt repositioned for transfer  And discussed importance of LE placement prior to transfer. Pt completed 2nd squat-pivot to R with Min A. Pt propelled wc to therapy gym and completed transfers L and R with min A and wc set-up.  R UE NMR in gravity eliminated position focused on wrist extension/flexion, scap protraction retraction, elbow flex/ext, and shoulder flex/ext. Pt then completed 12 mins using e-stim but difficult to achieve wrist flex/ext, so OT provided joint input to achieve wrist extension. Worked on one-handed LB dressing techniques, then pt returned to room and left seated in wc with needs met.   Therapy Documentation Precautions:  Precautions Precautions: Fall Required Braces or Orthoses: Sling, Cervical Brace (sling RUE during mobility due to brachial plexus injury) Cervical Brace: At all times Restrictions Weight Bearing Restrictions: Yes RUE Weight Bearing: Non weight bearing RLE Weight Bearing: Non weight bearing LLE Weight Bearing: Weight bearing as tolerated (transfers only) Other Position/Activity Restrictions: LLE WBAT for transfers only Pain: Pain Assessment Pain Assessment: 0-10 Pain Score: 2  Faces Pain Scale: No hurt Pain Type: Acute pain Pain  Location: Neck Pain Orientation: Right Pain Descriptors / Indicators: Aching Pain Onset: With Activity Pain Intervention(s): Repositioned  See Function Navigator for Current Functional Status.   Therapy/Group: Individual Therapy  Valma Cava 04/10/2017, 3:41 PM

## 2017-04-10 NOTE — Progress Notes (Signed)
Physical Therapy Session Note  Patient Details  Name: Ricardo Friedman MRN: [id number] Date of Birth: 07/27/1971  Today's Date: 04/10/2017 PT Individual Time: 1015-1055 PT Individual Time Calculation (min): 40 min   Short Term Goals: Week 1:  PT Short Term Goal 1 (Week 1): Pt will perform chair to/from bed transfer, Mod A x1 PT Short Term Goal 2 (Week 1): Pt will be Mod A for rolling R/L PT Short Term Goal 3 (Week 1): Pt will tolerate upright activity for 30+ minutes  PT Short Term Goal 4 (Week 1): Pt will maintain dynamic sitting balance with supervision  PT Short Term Goal 5 (Week 1): Pt will perform 50 ft with Mod A in w/c   Skilled Therapeutic Interventions/Progress Updates:    Session focused on d/c planning, uneven transfers, w/c mobility and parts management, and demonstration of how to bump w/c up/down step for home entry. Pt with 1 STE and demonstrated technique to patient and had pt stay in w/c to practice what it feels like. Recommending for caregiver to come for family education prior to d/c to practice. Uneven transfer with min assist for lifting for clearance of bottom and pt able to maintain precautions (did not need assist with RLE management) with cues for efficient technique and hand placement. Repeated x 4 to both directions. Simulated threshold on ramp lip and pt unable to navigate using hemi w/c technique. Pt has a threshold at home he was hoping to be able to navigate independently. End of session returned back to room with all needs in reach to use urinal (NT aware as sample needed).  Therapy Documentation Precautions:  Precautions Precautions: Fall Required Braces or Orthoses: Sling, Cervical Brace (sling RUE during mobility due to brachial plexus injury) Cervical Brace: At all times Restrictions Weight Bearing Restrictions: Yes RUE Weight Bearing: Weight bearing as tolerated RLE Weight Bearing: Non weight bearing LLE Weight Bearing: Weight bearing as tolerated  (transfers only) Other Position/Activity Restrictions: LLE WBAT for transfers only   Pain: Premedicated for generalized pain.    See Function Navigator for Current Functional Status.   Therapy/Group: Individual Therapy  Karolee StampsGray, Alayshia Marini Darrol PokeBrescia  Nain Rudd B. Kelin Nixon, PT, DPT  04/10/2017, 12:12 PM

## 2017-04-10 NOTE — Progress Notes (Signed)
ANTICOAGULATION CONSULT NOTE - Follow Up Consult  Pharmacy Consult for coumadin Indication: VTE prophylaxis  No Known Allergies  Patient Measurements: Height: 5\' 10"  (177.8 cm) Weight: 187 lb 11.2 oz (85.1 kg) IBW/kg (Calculated) : 73 Heparin Dosing Weight:   Vital Signs: Temp: 99.8 F (37.7 C) (07/09 0410) Temp Source: Oral (07/09 0410) BP: 120/70 (07/09 0410) Pulse Rate: 85 (07/09 0410)  Labs:  Recent Labs  04/08/17 0443 04/09/17 0519 04/10/17 0421  HGB  --   --  10.7*  HCT  --   --  31.4*  PLT  --   --  445*  LABPROT 24.7* 26.1* 25.0*  INR 2.18 2.35 2.22  CREATININE  --   --  0.58*    Estimated Creatinine Clearance: 119.1 mL/min (A) (by C-G formula based on SCr of 0.58 mg/dL (L)).   Medications:  Scheduled:  . bethanechol  10 mg Oral TID  . feeding supplement (PRO-STAT SUGAR FREE 64)  30 mL Oral BID  . gabapentin  400 mg Oral TID  . methocarbamol  500 mg Oral TID  . pantoprazole  40 mg Oral Daily   Or  . pantoprazole (PROTONIX) IV  40 mg Intravenous Daily  . senna-docusate  1 tablet Oral BID  . tamsulosin  0.4 mg Oral Daily  . traMADol  50 mg Oral TID  . warfarin  5 mg Oral q1800  . Warfarin - Pharmacist Dosing Inpatient   Does not apply q1800   Infusions:    Assessment: 46 yo male is currently on therapeutic coumadin for VTE prophylaxis.  INR today is 2.22.  Goal of Therapy:  INR 2-3 Monitor platelets by anticoagulation protocol: Yes   Plan:  - continue coumadin 5 mg po daily for now - INR in am  Ricardo Friedman, Tsz-Yin 04/10/2017,8:07 AM

## 2017-04-10 NOTE — Progress Notes (Signed)
Physical Therapy Session Note  Patient Details  Name: Ricardo RummageRichard Quirarte MRN: [id number] Date of Birth: 07/26/1971  Today's Date: 04/10/2017 PT Individual Time: 0920-1000 PT Individual Time Calculation (min): 40 min   Short Term Goals: Week 1:  PT Short Term Goal 1 (Week 1): Pt will perform chair to/from bed transfer, Mod A x1 PT Short Term Goal 2 (Week 1): Pt will be Mod A for rolling R/L PT Short Term Goal 3 (Week 1): Pt will tolerate upright activity for 30+ minutes  PT Short Term Goal 4 (Week 1): Pt will maintain dynamic sitting balance with supervision  PT Short Term Goal 5 (Week 1): Pt will perform 50 ft with Mod A in w/c   Skilled Therapeutic Interventions/Progress Updates:    Pt supine in bed upon, PT arrival and agreeable to therapy. Pt had just returned from XRAY and missed 20 minutes. Pt transferred from supine to sitting EOB with supervision and use of handrails. Pt transferred from EOB to w/c with min assist, verbal cues to maintain precautions. Pt propelled w/c 150 ft to the gym with supervision. Pt transferred from w/c to mat with supervision for balance and verbal cues to maintain precautions. Pt transferred sitting edge of mat to supine with supervision. In supine, pt performed glute stretch 2 x 30 sec, piriformis stretch 2 x 30 sec, quad sets 2 x 10, hip abd/adduction 2 x 10 and hip flexion 2 x 10 in order to work on strengthening and ROM. Pt transferred back to w/c with min assist, left in w/c in room with call bell in reach.   Therapy Documentation Precautions:  Precautions Precautions: Fall Required Braces or Orthoses: Sling, Cervical Brace (sling RUE during mobility due to brachial plexus injury) Cervical Brace: At all times Restrictions Weight Bearing Restrictions: Yes RUE Weight Bearing: Weight bearing as tolerated RLE Weight Bearing: Non weight bearing LLE Weight Bearing: Weight bearing as tolerated (transfers only) Other Position/Activity Restrictions: LLE WBAT for  transfers only General: PT Amount of Missed Time (min): 20 Minutes PT Missed Treatment Reason: Xray   See Function Navigator for Current Functional Status.   Therapy/Group: Individual Therapy  Cresenciano GenreEmily van Schagen, PT, DPT 04/10/2017, 12:15 PM

## 2017-04-11 ENCOUNTER — Inpatient Hospital Stay (HOSPITAL_COMMUNITY): Payer: Commercial Managed Care - PPO | Admitting: Occupational Therapy

## 2017-04-11 ENCOUNTER — Inpatient Hospital Stay (HOSPITAL_COMMUNITY): Payer: Commercial Managed Care - PPO

## 2017-04-11 DIAGNOSIS — R8290 Unspecified abnormal findings in urine: Secondary | ICD-10-CM

## 2017-04-11 DIAGNOSIS — J189 Pneumonia, unspecified organism: Secondary | ICD-10-CM

## 2017-04-11 LAB — PROTIME-INR
INR: 2.28
Prothrombin Time: 25.5 seconds — ABNORMAL HIGH (ref 11.4–15.2)

## 2017-04-11 MED ORDER — PIPERACILLIN-TAZOBACTAM 3.375 G IVPB
3.3750 g | Freq: Three times a day (TID) | INTRAVENOUS | Status: DC
  Administered 2017-04-11 – 2017-04-15 (×10): 3.375 g via INTRAVENOUS
  Filled 2017-04-11 (×12): qty 50

## 2017-04-11 MED ORDER — VANCOMYCIN HCL IN DEXTROSE 1-5 GM/200ML-% IV SOLN
1000.0000 mg | Freq: Three times a day (TID) | INTRAVENOUS | Status: DC
  Administered 2017-04-11 – 2017-04-15 (×10): 1000 mg via INTRAVENOUS
  Filled 2017-04-11 (×12): qty 200

## 2017-04-11 NOTE — Progress Notes (Signed)
Pharmacy Antibiotic Note  Ricardo Friedman is a 46 y.o. male admitted on 04/05/2017 with pneumonia.  Pharmacy has been consulted for vancomycin and zosyn dosing.  Vancomycin trough 15-20  Plan: - vancomycin 1g iv q8h - zosyn 3.375g iv q8h (4h infusion) - check vancomycin trough when it's appropriate - monitor renal fxn  Height: 5\' 10"  (177.8 cm) Weight: 187 lb 11.2 oz (85.1 kg) IBW/kg (Calculated) : 73  Temp (24hrs), Avg:99 F (37.2 C), Min:98.9 F (37.2 C), Max:99 F (37.2 C)   Recent Labs Lab 04/05/17 0414 04/06/17 0636 04/10/17 0421  WBC 12.0* 12.4* 21.7*  CREATININE 0.67 0.57* 0.58*    Estimated Creatinine Clearance: 119.1 mL/min (A) (by C-G formula based on SCr of 0.58 mg/dL (L)).    No Known Allergies   Thank you for allowing pharmacy to be a part of this patient's care.  Ricardo Friedman, Tsz-Yin 04/11/2017 8:41 AM

## 2017-04-11 NOTE — Progress Notes (Addendum)
Occupational Therapy Session Note  Patient Details  Name: Ricardo RummageRichard Friedman MRN: [id number] Date of Birth: 09/23/1971  Today's Date: 04/11/2017 OT Individual Time: 0830-0900 OT Individual Time Calculation (min): 30 min                                                           Make up session   Short Term Goals: Week 1:  OT Short Term Goal 1 (Week 1): STGs equal to LTGs based on ELOS  Skilled Therapeutic Interventions/Progress Updates:    Pt completed functional scooting transfers from wheelchair to and from the drop arm commode.  He was able to complete with close supervision adhering to his weightbearing precautions in the RLE.  Min guard for managing clothing as well in sitting, with lateral leans side to side.  [name] rolled himself down to the ADL apartment where he also practiced scoot transfers to the tub bench, positioned in the simulated walk-in shower obstacle.  Finished session with return to room in wheelchair.  Call button and phone in reach.    Therapy Documentation Precautions:  Precautions Precautions: Fall Required Braces or Orthoses: Sling, Cervical Brace Cervical Brace: At all times Restrictions Weight Bearing Restrictions: Yes RUE Weight Bearing: Weight bearing as tolerated RLE Weight Bearing: Weight bearing as tolerated LLE Weight Bearing: Non weight bearing Other Position/Activity Restrictions: LLE WBAT for transfers only  Pain: Pain Assessment Pain Assessment: No/denies pain ADL: See Function Navigator for Current Functional Status.   Therapy/Group: Individual Therapy  Rome Echavarria OTR/L 04/11/2017, 10:40 AM

## 2017-04-11 NOTE — Progress Notes (Addendum)
Occupational Therapy Session Note  Patient Details  Name: Ricardo Friedman MRN: [id number] Date of Birth: 1970-10-30  Today's Date: 04/11/2017  Session 1 OT Individual Time: 1100-1156 OT Individual Time Calculation (min): 56 min   Session 2 OT Individual Time: 4818-5631 OT Individual Time Calculation (min): 69 min    Short Term Goals: Week 1:  OT Short Term Goal 1 (Week 1): STGs equal to LTGs based on ELOS  Skilled Therapeutic Interventions/Progress Updates:  Session 1   Pt cleared by PA for shower. Pt greeted supine in bed and agreeable to OT. Treatment session focused on modified bathing/dressing, transfer training, and pt education. OT reviewed shower protocol for c-collar, then pt completed lateral transfer bed>wc on R side with R UE in sling with supervision. Pt then completed lateral transfer onto tub bench with min cues and supervision. OT covered L LE with water proof bag, then pt able to doff pants using lateral leans and increased time. Pt then able to doff shirt and was set-up for shower level bathing. c-collar remained on during shower. Provided pt with long-handled sponge for increased independence, and pt able to complete all bathing with setup A and supervision. Pt transferred TTB>wc>bed with wc set-up and supervision. Shirt donned using hemi-techniques with set-up A, and min A to pull pants over hips. Pt returned to supine and OT removed c-collar for pad change. C-collar re-donned and educated pt on technique and need for family education. Pt left semi-reclined in bed with R UE elevated and in care of IV team.   Session 2 Discussed with PA Dan for order of R wrist-cock up splint for R wrist support and for maintenance of palmer arch- PA to order. OT treatment session focused on dc planning, community reintegration at wc level, one-handed activity modifications, and R NMR. Pt transferred from flat surface of bed with increased time and close supervision. Lateral transfers with  supervision. OT discussed safety awareness within community environment and wc access using public elevators. Pt propelled w.c outside with min cues for positioning wc in and out of elevator. Pt propelled wc up slight incline to picnic table. R NMR and gentle ROM exercises of R UE completed with joint input to bring pt through full ROM. Discussed DME needs and ADL participation within home environment. Provided pt with dysom and demonstrated multiple ways to utilize to increase independence with self-feeding, writing, cooking etc. Educated on ways to utilize R UE as a stabilizer to open containers for grooming tasks. Pt left seated in wc at end of session with needs met and call bell in reach.    Therapy Documentation Precautions:  Precautions Precautions: Fall Required Braces or Orthoses: Sling, Cervical Brace Cervical Brace: At all times Restrictions Weight Bearing Restrictions: Yes RUE Weight Bearing: Weight bearing as tolerated RLE Weight Bearing: Weight bearing as tolerated LLE Weight Bearing: Non weight bearing Other Position/Activity Restrictions: LLE WBAT for transfers only Pain: Pain Assessment Pain Assessment: 0-10 Pain Score: 3  Faces Pain Scale: Hurts a little bit Pain Type: Acute pain Pain Location: Neck Pain Orientation: Right Pain Descriptors / Indicators: Aching Pain Onset: On-going Pain Intervention(s): Repositioned  See Function Navigator for Current Functional Status.   Therapy/Group: Individual Therapy  Valma Cava 04/11/2017, 2:41 PM

## 2017-04-11 NOTE — Progress Notes (Signed)
Physical Therapy Session Note  Patient Details  Name: Ricardo RummageRichard Friedman MRN: [id number] Date of Birth: 07/16/1971  Today's Date: 04/11/2017 PT Individual Time: 0900-1000 PT Individual Time Calculation (min): 60 min   Short Term Goals: Week 1:  PT Short Term Goal 1 (Week 1): Pt will perform chair to/from bed transfer, Mod A x1 PT Short Term Goal 2 (Week 1): Pt will be Mod A for rolling R/L PT Short Term Goal 3 (Week 1): Pt will tolerate upright activity for 30+ minutes  PT Short Term Goal 4 (Week 1): Pt will maintain dynamic sitting balance with supervision  PT Short Term Goal 5 (Week 1): Pt will perform 50 ft with Mod A in w/c   Skilled Therapeutic Interventions/Progress Updates:    Pt sitting in w/c upon PT arrival, agreeable to therapy tx. Session focused on uneven transfers with the slideboard and w/c propulsion on uneven surfaces. Pt propelled w/c >38400ft using L UE and L UE with supervision, navigated incline/decline x 20 ft with min assist to help control speed of w/c with descention. Educated patient that using the slideboard would be more beneficial for transfers requiring a larger gap to cross or uneven levels such as the car and bed. Pt performed w/c<>recliner uneven transfer lateral scoot with slideboard, w/c<>bed uneven transfer lateral scoot with slideboard and w/c<>car lateral scoot using slideboard all with min assist in order to help maintain NWB precautions on the R LE. Pt left seated in w/c in room with call bell in reach.   Therapy Documentation Precautions:  Precautions Precautions: Fall Required Braces or Orthoses: Sling, Cervical Brace Cervical Brace: At all times Restrictions Weight Bearing Restrictions: Yes RUE Weight Bearing: Weight bearing as tolerated RLE Weight Bearing: Weight bearing as tolerated LLE Weight Bearing: Non weight bearing Other Position/Activity Restrictions: LLE WBAT for transfers only Pain: Pain Assessment Pain Assessment: No/denies pain  See  Function Navigator for Current Functional Status.   Therapy/Group: Individual Therapy  Cresenciano GenreEmily van Schagen, PT, DPT 04/11/2017, 12:06 PM

## 2017-04-11 NOTE — Progress Notes (Signed)
ANTICOAGULATION CONSULT NOTE - Follow Up Consult  Pharmacy Consult for coumadin Indication: VTE prophylaxis  No Known Allergies  Patient Measurements: Height: 5\' 10"  (177.8 cm) Weight: 187 lb 11.2 oz (85.1 kg) IBW/kg (Calculated) : 73 Heparin Dosing Weight:   Vital Signs: Temp: 98.9 F (37.2 C) (07/10 0608) Temp Source: Oral (07/10 0608) BP: 113/65 (07/10 0608) Pulse Rate: 101 (07/10 0608)  Labs:  Recent Labs  04/09/17 0519 04/10/17 0421 04/11/17 0438  HGB  --  10.7*  --   HCT  --  31.4*  --   PLT  --  445*  --   LABPROT 26.1* 25.0* 25.5*  INR 2.35 2.22 2.28  CREATININE  --  0.58*  --     Estimated Creatinine Clearance: 119.1 mL/min (A) (by C-G formula based on SCr of 0.58 mg/dL (L)).   Medications:  Scheduled:  . bethanechol  10 mg Oral TID  . feeding supplement (PRO-STAT SUGAR FREE 64)  30 mL Oral BID  . gabapentin  400 mg Oral TID  . methocarbamol  500 mg Oral TID  . pantoprazole  40 mg Oral Daily   Or  . pantoprazole (PROTONIX) IV  40 mg Intravenous Daily  . senna-docusate  1 tablet Oral BID  . tamsulosin  0.4 mg Oral Daily  . traMADol  50 mg Oral TID  . warfarin  5 mg Oral q1800  . Warfarin - Pharmacist Dosing Inpatient   Does not apply q1800   Infusions:    Assessment: 46 yo male is currently on therapeutic coumadin for VTE prophylaxis.  INR today is 2.28.  Goal of Therapy:  INR 2-3 Monitor platelets by anticoagulation protocol: Yes   Plan:  - continue coumadin 5 mg po daily - INR in am  Ricardo Friedman, Tsz-Yin 04/11/2017,8:03 AM

## 2017-04-11 NOTE — Progress Notes (Signed)
[place] PHYSICAL MEDICINE & REHABILITATION     PROGRESS NOTE  Subjective/Complaints:  Pt seen laying in bed this AM.  He slept better overnight.  He has the same questions regarding conference tomorrow and his girlfriend wanting to take off from work to be here.    ROS: Denies nausea, vomiting, diarrhea, shortness of breath or chest pain   Objective: Vital Signs: Blood pressure 113/65, pulse (!) 101, temperature 98.9 F (37.2 C), temperature source Oral, resp. rate 18, height 5\' 10"  (1.778 m), weight 85.1 kg (187 lb 11.2 oz), SpO2 98 %. Dg Chest 1 View  Result Date: 04/10/2017 CLINICAL DATA:  Fever, leukocytosis. EXAM: CHEST 1 VIEW COMPARISON:  None. FINDINGS: The heart size and mediastinal contours are within normal limits. No pneumothorax or pleural effusion is noted. Left lung is clear. Mild right basilar subsegmental atelectasis or infiltrate is noted. The visualized skeletal structures are unremarkable. IMPRESSION: Mild right basilar subsegmental atelectasis or pneumonia is noted. Followup PA and lateral chest X-ray is recommended in 3-4 weeks following trial of antibiotic therapy to ensure resolution and exclude underlying malignancy. Electronically Signed   By: Lupita Raider, M.D.   On: 04/10/2017 09:24    Recent Labs  04/10/17 0421  WBC 21.7*  HGB 10.7*  HCT 31.4*  PLT 445*    Recent Labs  04/10/17 0421  NA 131*  K 3.5  CL 99*  GLUCOSE 113*  BUN 9  CREATININE 0.58*  CALCIUM 8.7*   CBG (last 3)  No results for input(s): GLUCAP in the last 72 hours.  Wt Readings from Last 3 Encounters:  04/05/17 85.1 kg (187 lb 11.2 oz)  03/30/17 95.5 kg (210 lb 8.6 oz)    Physical Exam:  BP 113/65 (BP Location: Left Arm)   Pulse (!) 101   Temp 98.9 F (37.2 C) (Oral)   Resp 18   Ht 5\' 10"  (1.778 m)   Wt 85.1 kg (187 lb 11.2 oz)   SpO2 98%   BMI 26.93 kg/m  Constitutional: He appears well-developed. NAD.  HENT: Normocephalic. Healing abrasions to the forehead Eyes:  EOMare normal. No discharge.   Neck: Cervical collar in place Cardiovascular: RRR. No JVD. Respiratory: CTA. Normal effort  GI: Soft. Bowel sounds are normal.  Musculoskeletal: He exhibits edema and tenderness, improving.  Neurological: He is alertand oriented.  Motor: RUE: Shoulder abduction 1+/5, distally 0/5  Sensation absent to light touch in RUE  LUE: 5/5 proximal to distal B/l LE: HF 3-/5, KE 3/5, ADF/PF 4/5 (pain inhibition).  Psychiatric: Normal mood and behavior Skin. Left lower extremity wound dressed. Pelvic wound clean and intact  Assessment/Plan: 1. Functional deficits secondary to polytrauma which require 3+ hours per day of interdisciplinary therapy in a comprehensive inpatient rehab setting. Physiatrist is providing close team supervision and 24 hour management of active medical problems listed below. Physiatrist and rehab team continue to assess barriers to discharge/monitor patient progress toward functional and medical goals.  Function:  Bathing Bathing position   Position: Bed  Bathing parts Body parts bathed by patient: Right arm, Chest, Abdomen, Front perineal area, Right upper leg, Left upper leg Body parts bathed by helper: Right lower leg, Left lower leg, Buttocks, Left arm, Back  Bathing assist        Upper Body Dressing/Undressing Upper body dressing   What is the patient wearing?: Pull over shirt/dress     Pull over shirt/dress - Perfomed by patient: Thread/unthread right sleeve, Thread/unthread left sleeve Pull over shirt/dress -  Perfomed by helper: Put head through opening, Pull shirt over trunk        Upper body assist        Lower Body Dressing/Undressing Lower body dressing   What is the patient wearing?: Non-skid slipper socks       Pants- Performed by helper: Thread/unthread right pants leg, Thread/unthread left pants leg, Pull pants up/down Non-skid slipper socks- Performed by patient: Don/doff right sock Non-skid slipper socks-  Performed by helper: Don/doff right sock, Don/doff left sock                  Lower body assist Assist for lower body dressing: Set up, Supervision or verbal cues, Assistive device Assistive Device Comment: sock aid and reacher    Toileting Toileting   Toileting steps completed by patient: Adjust clothing prior to toileting, Performs perineal hygiene, Adjust clothing after toileting Toileting steps completed by helper: Adjust clothing prior to toileting Toileting Assistive Devices: Grab bar or rail  Toileting assist Assist level: Supervision or verbal cues   Transfers Chair/bed transfer   Chair/bed transfer method: Squat pivot Chair/bed transfer assist level: Touching or steadying assistance (Pt > 75%) Chair/bed transfer assistive device: Armrests     Locomotion Ambulation Ambulation activity did not occur: Safety/medical concerns         Wheelchair   Type: Manual Max wheelchair distance: 150' Assist Level: Supervision or verbal cues  Cognition Comprehension Comprehension assist level: Follows complex conversation/direction with no assist  Expression Expression assist level: Expresses complex ideas: With no assist  Social Interaction Social Interaction assist level: Interacts appropriately with others - No medications needed.  Problem Solving Problem solving assist level: Solves complex problems: Recognizes & self-corrects  Memory Memory assist level: Complete Independence: No helper    Medical Problem List and Plan: 1. Decreased functional mobilitysecondary to anterior inferior left C6 vertebral corner fracture as well as widening of C6-7 facet joint with capsule disruption and ligamentous injury, C8 and T1 nerve root avulsion with right brachial plexus injury, cervical collar at all times, pelvic ring fracture with zone 3 fracture, right SI diastasis as well as left open fibular fracture status post ORIF anterior entrance sacral screw for sacral fracture.  Weightbearing as tolerated left lower extremity for transfers only nonweightbearing right lower extremity.  Cont CIR   2. DVT Prophylaxis/Anticoagulation: Coumadin for DVT prophylaxis.  Vascular studies neg  INR therpauetic 7/10 3. Pain Management: Ultram 50 mg 3 times a day and oxycodone as needed  Gabapentin 300 TID started 7/5, increased to 400mg  tid 7/9  Robaxin 500 TID started 7/6 4. Mood: Provide emotional support 5. Neuropsych: This patient iscapable of making decisions on hisown behalf. 6. Skin/Wound Care: Skin care as directed 7. 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 Contact precautions 9.Urinary retention. Urecholine 10 mg 3 times a day as well as Flomax 0.4 mg daily.   PVR with some retention, cont meds  Foley d/ced 10.Constipation.Laxative assistance 11.Tobacco abuse. Counseling 12. Hyperglycemia  Likely stress induced  Relatively controlled 13. Hyponatremia  Na+ 131 on 7/9  Cont to monitor  Labs ordered for tomorrow 14. Transaminitis  Relatively stable 7/9  Cont to follow 15. Hypoalbuminemia  Supplement initiated 7/5 16. ABLA  Hb 10.7 on 7/9  Cont to monitor  Labs ordered for tomorrow 17. Leukocytosis  WBCs 21.7 on 7/9  UA ?+, Ucx NG, repeat UA+, Ucx pending  CXR showing likely PNA 18. Elevated blood pressure  Controlled 7/9 19. HCAP  Vanc/Zosyn started 7/10  Repeat  CXR recommended in 3-4 weeks 20. ?UTI  Ucx pending   LOS (Days) 6 A FACE TO FACE EVALUATION WAS PERFORMED  Ankit Karis Juba 04/11/2017 8:12 AM

## 2017-04-12 ENCOUNTER — Inpatient Hospital Stay (HOSPITAL_COMMUNITY): Payer: Commercial Managed Care - PPO

## 2017-04-12 ENCOUNTER — Encounter (HOSPITAL_COMMUNITY): Payer: Self-pay

## 2017-04-12 ENCOUNTER — Inpatient Hospital Stay (HOSPITAL_COMMUNITY): Payer: Commercial Managed Care - PPO | Admitting: Occupational Therapy

## 2017-04-12 DIAGNOSIS — D72828 Other elevated white blood cell count: Secondary | ICD-10-CM

## 2017-04-12 DIAGNOSIS — S12500S Unspecified displaced fracture of sixth cervical vertebra, sequela: Secondary | ICD-10-CM

## 2017-04-12 LAB — URINE CULTURE

## 2017-04-12 LAB — CBC WITH DIFFERENTIAL/PLATELET
BASOS ABS: 0.1 10*3/uL (ref 0.0–0.1)
BASOS PCT: 1 %
EOS ABS: 0.4 10*3/uL (ref 0.0–0.7)
Eosinophils Relative: 3 %
HCT: 35.3 % — ABNORMAL LOW (ref 39.0–52.0)
HEMOGLOBIN: 11.7 g/dL — AB (ref 13.0–17.0)
LYMPHS PCT: 21 %
Lymphs Abs: 2.6 10*3/uL (ref 0.7–4.0)
MCH: 30.5 pg (ref 26.0–34.0)
MCHC: 33.1 g/dL (ref 30.0–36.0)
MCV: 91.9 fL (ref 78.0–100.0)
MONO ABS: 1 10*3/uL (ref 0.1–1.0)
Monocytes Relative: 8 %
NEUTROS PCT: 67 %
Neutro Abs: 8.4 10*3/uL — ABNORMAL HIGH (ref 1.7–7.7)
PLATELETS: 637 10*3/uL — AB (ref 150–400)
RBC: 3.84 MIL/uL — ABNORMAL LOW (ref 4.22–5.81)
RDW: 14.5 % (ref 11.5–15.5)
WBC: 12.5 10*3/uL — ABNORMAL HIGH (ref 4.0–10.5)

## 2017-04-12 LAB — BASIC METABOLIC PANEL
Anion gap: 8 (ref 5–15)
BUN: 15 mg/dL (ref 6–20)
CHLORIDE: 101 mmol/L (ref 101–111)
CO2: 25 mmol/L (ref 22–32)
CREATININE: 0.59 mg/dL — AB (ref 0.61–1.24)
Calcium: 9.2 mg/dL (ref 8.9–10.3)
Glucose, Bld: 105 mg/dL — ABNORMAL HIGH (ref 65–99)
POTASSIUM: 3.7 mmol/L (ref 3.5–5.1)
SODIUM: 134 mmol/L — AB (ref 135–145)

## 2017-04-12 LAB — PROTIME-INR
INR: 2.09
PROTHROMBIN TIME: 23.8 s — AB (ref 11.4–15.2)

## 2017-04-12 MED ORDER — WARFARIN SODIUM 7.5 MG PO TABS
7.5000 mg | ORAL_TABLET | Freq: Once | ORAL | Status: AC
  Administered 2017-04-12: 7.5 mg via ORAL
  Filled 2017-04-12: qty 1

## 2017-04-12 NOTE — Progress Notes (Signed)
Physical Therapy Session Note  Patient Details  Name: Ricardo RummageRichard Bin MRN: [id number] Date of Birth: 12/27/1970  Today's Date: 04/12/2017 PT Individual Time: 0900-1000, 1430-1530 PT Individual Time Calculation (min): 60 min, 60 min   Short Term Goals: Week 1:  PT Short Term Goal 1 (Week 1): Pt will perform chair to/from bed transfer, Mod A x1 PT Short Term Goal 2 (Week 1): Pt will be Mod A for rolling R/L PT Short Term Goal 3 (Week 1): Pt will tolerate upright activity for 30+ minutes  PT Short Term Goal 4 (Week 1): Pt will maintain dynamic sitting balance with supervision  PT Short Term Goal 5 (Week 1): Pt will perform 50 ft with Mod A in w/c   Skilled Therapeutic Interventions/Progress Updates:   Session 1: 0900-1000   pt sitting in w/c upon PT arrival, agreeable to therapy tx. Pt propelled w/c 150 ft to the gym using L UE and L LE with supervision. Pt transferred from w/c to mat with min assist, squat pivot transfer. Pt transferred from sitting edge of mat to supine with supervision. In supine pt performed stretching and LE strengthening exercises to improve strength and ROM. Pt performed glute, piriformis, hamstring and calf stretchs each 2 x 30 sec. Pt performed SAQ 2 x 10, heel slides 2 x 10 and hip flexion marches 2 x 10. Pt transferred back to w/c squat pivot with min assist for balance and to maintain precautions. Pt practiced transfer from w/c to recliner using slideboard for uneven surfaces with min assist, verbal cues for set up. Pt states that he has a threshold to cross at home to get outside so pt practice crossing threshold up ramp, trial 1 with min assist and trial 2 with supervision. Pt propelled w/c back to room with supervision, left in w/c with needs in reach. Pt reported 4/10 pain this session, relieved some with position changes.   Session 2: 1430-1530 Pt sitting in w/c upon PT arrival, agreeable to therapy tx. Pt propelled w/c to gym with supervision x 11750ft, using L LE and  L UE. Pt transferred from w/c to mat squat pivot with min assist to maintain precautions. Session focused on dynamic sitting balance. Pt sitting on dynadisc with L LE supported on floor while throwing horseshoes, sitting on dynadisc with L LE supported hitting ball back and forth, and sitting on dynadisc without LE support hitting ball back and forth. Performed hamstring and DF stretches bilaterally 2 x 30 sec. Sitting edge of mat pt performed 2 x 20 LAQ for LE strengthening. Pt transferred back to w/c squat pivot with min assist to maintain precautions. Pt propelled w/c back to room x 13750ft with supervision. Pt left seated in w/c with needs in reach.   Therapy Documentation Precautions:  Precautions Precautions: Fall Required Braces or Orthoses: Sling, Cervical Brace Cervical Brace: At all times Restrictions Weight Bearing Restrictions: Yes RUE Weight Bearing: Weight bearing as tolerated RLE Weight Bearing: Non weight bearing LLE Weight Bearing: Weight bearing as tolerated Other Position/Activity Restrictions: LLE WBAT for transfers only   See Function Navigator for Current Functional Status.   Therapy/Group: Individual Therapy  Cresenciano GenreEmily van Schagen, PT, DPT 04/12/2017, 11:30 AM

## 2017-04-12 NOTE — Progress Notes (Signed)
Occupational Therapy Session Note  Patient Details  Name: Ricardo Friedman MRN: [id number] Date of Birth: 1970-12-10  Today's Date: 04/12/2017  Session 1 OT Individual Time: 1859-0931 OT Individual Time Calculation (min): 27 min   Session 2 OT Individual Time: 1345-1430 OT Individual Time Calculation (min): 45 min   Short Term Goals: Week 1:  OT Short Term Goal 1 (Week 1): STGs equal to LTGs based on ELOS  Skilled Therapeutic Interventions/Progress Updates:    Session 1 OT treatment session focused on R UE ROM. Pt propelled wc to therapy gym w/o assistance. Reviewed wc functions with pt and wc positioning for transfer. Pt able to remove armrest and put on breaks. Lateral scoot wc>therapy mat with close supervision. Utilized UE ranger for shoulder flexion/extension, and internal/extranal rotation. Completed 3 sets x10. Pt returned to room at end of session and left with lunch set-up.   Session 2 Treatment session focused on L UE NMR, transfer training, and giv-mor sling fitting. Pt propelled wc to therapy gym and worked on [place] with R UE ranger. OT then fit pt with giv-mor sling to provide R UE support without placing stress on neck. Pt transferred <>wc with sling with improve comfort and control. Pt then practiced tub transfer and was able to lift B LEs in and out of tub by leaning back to decrease pelvic discomfort. R UE NMR using towel push seated at kitchen table with joint input provided to bring pt through full ROM flex/ext. Pt returned to room and left seated in wc with needs met.   Therapy Documentation Precautions:  Precautions Precautions: Fall Required Braces or Orthoses: Sling, Cervical Brace Cervical Brace: At all times Restrictions Weight Bearing Restrictions: Yes RUE Weight Bearing: Weight bearing as tolerated RLE Weight Bearing: Non weight bearing LLE Weight Bearing: Weight bearing as tolerated Other Position/Activity Restrictions: LLE WBAT for transfers  only Pain: Pain Assessment Pain Score: 0-No pain  See Function Navigator for Current Functional Status.   Therapy/Group: Individual Therapy  Valma Cava 04/12/2017, 3:47 PM

## 2017-04-12 NOTE — Plan of Care (Signed)
Problem: RH Bed Mobility Goal: LTG Patient will perform bed mobility with assist (PT) LTG: Patient will perform bed mobility with assistance, without cues (PT).  upgraded due to progress  Problem: RH Bed to Chair Transfers Goal: LTG Patient will perform bed/chair transfers w/assist (PT) LTG: Patient will perform bed/chair transfers with assistance, without cues (PT).  upgraded due to progress  Problem: RH Car Transfers Goal: LTG Patient will perform car transfers with assist (PT) LTG: Patient will perform car transfers with assistance (PT).  upgraded due to progress  Problem: RH Wheelchair Mobility Goal: LTG Patient will propel w/c in controlled environment (PT) LTG: Patient will propel wheelchair in controlled environment, 150 feet with assist (PT)  upgraded due to progress Goal: LTG Patient will propel w/c in home environment (PT) LTG: Patient will propel wheelchair in home environment, 150 feet with assistance (PT).  upgraded due to progress  Problem: RH Balance Goal: LTG Patient will maintain dynamic sitting balance (PT) LTG:  Patient will maintain dynamic sitting balance with assistance during mobility activities (PT)  upgraded due to progress

## 2017-04-12 NOTE — Plan of Care (Signed)
Problem: RH Bathing Goal: LTG Patient will bathe with assist, cues/equipment (OT) LTG: Patient will bathe specified number of body parts with assist with/without cues using equipment (position)  (OT)  Goal upgraded 7/11 ESD  Problem: RH Dressing Goal: LTG Patient will perform lower body dressing w/assist (OT) LTG: Patient will perform lower body dressing with assist, with/without cues in positioning using equipment (OT)  Goal upgraded 7/11 ESD  Comments: Goals upgraded 7/11 ESD

## 2017-04-12 NOTE — Plan of Care (Signed)
Problem: RH BOWEL ELIMINATION Goal: RH STG MANAGE BOWEL WITH ASSISTANCE STG Manage Bowel with Assistance. Outcome: Progressing Continent of bowel with moderate assistance  Problem: RH SKIN INTEGRITY Goal: RH STG SKIN FREE OF INFECTION/BREAKDOWN Outcome: Progressing No s/s of skin  infection or breakdown noted, dressing clean, dry and intact to left lower leg  Problem: RH SAFETY Goal: RH STG ADHERE TO SAFETY PRECAUTIONS W/ASSISTANCE/DEVICE STG Adhere to Safety Precautions With Assistance/Device. Outcome: Progressing Safety precautions maintained  Problem: RH PAIN MANAGEMENT Goal: RH STG PAIN MANAGED AT OR BELOW PT'S PAIN GOAL Outcome: Progressing Medicated twice for pain with moderate relief

## 2017-04-12 NOTE — Progress Notes (Signed)
[place] PHYSICAL MEDICINE & REHABILITATION     PROGRESS NOTE  Subjective/Complaints:    No CP or SOB, no sweats or chills  ROS: Denies nausea, vomiting, diarrhea, shortness of breath or chest pain   Objective: Vital Signs: Blood pressure 119/69, pulse 96, temperature 98.4 F (36.9 C), temperature source Oral, resp. rate 18, height _0  (1.778 m), weight 85.1 kg (187 lb 11.2 oz), SpO2 97 %. Dg Chest 1 View  Result Date: 04/10/2017 CLINICAL DATA:  Fever, leukocytosis. EXAM: CHEST 1 VIEW COMPARISON:  None. FINDINGS: The heart size and mediastinal contours are within normal limits. No pneumothorax or pleural effusion is noted. Left lung is clear. Mild right basilar subsegmental atelectasis or infiltrate is noted. The visualized skeletal structures are unremarkable. IMPRESSION: Mild right basilar subsegmental atelectasis or pneumonia is noted. Followup PA and lateral chest X-ray is recommended in 3-4 weeks following trial of antibiotic therapy to ensure resolution and exclude underlying malignancy. Electronically Signed   By: Marijo Conception, M.D.   On: 04/10/2017 09:24    Recent Labs  04/10/17 0421  WBC 21.7*  HGB 10.7*  HCT 31.4*  PLT 445*    Recent Labs  04/10/17 0421  NA 131*  K 3.5  CL 99*  GLUCOSE 113*  BUN 9  CREATININE 0.58*  CALCIUM 8.7*   CBG (last 3)  No results for input(s): GLUCAP in the last 72 hours.  Wt Readings from Last 3 Encounters:  04/05/17 85.1 kg (187 lb 11.2 oz)  03/30/17 95.5 kg (210 lb 8.6 oz)    Physical Exam:  BP 119/69 (BP Location: Left Arm)   Pulse 96   Temp 98.4 F (36.9 C) (Oral)   Resp 18   Ht _1  (1.778 m)   Wt 85.1 kg (187 lb 11.2 oz)   SpO2 97%   BMI 26.93 kg/m  Constitutional: He appears well-developed. NAD.  HENT: Normocephalic. Healing abrasions to the forehead Eyes: EOMare normal. No discharge.   Neck: Cervical collar in place Cardiovascular: RRR. No JVD. Respiratory: CTA. Normal effort  GI: Soft. Bowel  sounds are normal.  Musculoskeletal: He exhibits edema and tenderness, improving.  Neurological: He is alertand oriented.  Motor: RUE: Shoulder abduction 1+/5, distally 0/5  Sensation absent to light touch in RUE  LUE: 5/5 proximal to distal B/l LE: HF 3-/5, KE 3/5, ADF/PF 4/5 (pain inhibition).  Psychiatric: Normal mood and behavior Skin. Left lower extremity wound dressed. Pelvic wound clean and intact  Assessment/Plan: 1. Functional deficits secondary to polytrauma which require 3+ hours per day of interdisciplinary therapy in a comprehensive inpatient rehab setting. Physiatrist is providing close team supervision and 24 hour management of active medical problems listed below. Physiatrist and rehab team continue to assess barriers to discharge/monitor patient progress toward functional and medical goals.  Function:  Bathing Bathing position   Position: Shower  Bathing parts Body parts bathed by patient: Left arm, Right arm, Chest, Abdomen, Front perineal area, Buttocks, Right upper leg, Left upper leg, Right lower leg, Back Body parts bathed by helper: Right lower leg, Left lower leg, Buttocks, Left arm, Back  Bathing assist Assist Level: Assistive device, Set up, Supervision or verbal cues Assistive Device Comment: long-handled sponge Set up : To obtain items, To open containers  Upper Body Dressing/Undressing Upper body dressing   What is the patient wearing?: Pull over shirt/dress     Pull over shirt/dress - Perfomed by patient: Thread/unthread right sleeve, Thread/unthread left sleeve, Put head through opening, Pull shirt  over trunk Pull over shirt/dress - Perfomed by helper: Put head through opening, Pull shirt over trunk        Upper body assist Assist Level: Set up, Supervision or verbal cues   Set up : To obtain clothing/put away  Lower Body Dressing/Undressing Lower body dressing   What is the patient wearing?: Pants, Non-skid slipper socks     Pants- Performed  by patient: Thread/unthread left pants leg, Thread/unthread right pants leg Pants- Performed by helper: Pull pants up/down Non-skid slipper socks- Performed by patient: Don/doff right sock Non-skid slipper socks- Performed by helper: Don/doff right sock, Don/doff left sock                  Lower body assist Assist for lower body dressing: Touching or steadying assistance (Pt > 75%), Set up Assistive Device Comment: sock aid and reacher Set up : To obtain clothing/put away  Toileting Toileting   Toileting steps completed by patient: Adjust clothing prior to toileting, Performs perineal hygiene, Adjust clothing after toileting Toileting steps completed by helper: Adjust clothing prior to toileting Toileting Assistive Devices: Grab bar or rail  Toileting assist Assist level: Supervision or verbal cues   Transfers Chair/bed transfer   Chair/bed transfer method: Squat pivot, Lateral scoot Chair/bed transfer assist level: Touching or steadying assistance (Pt > 75%) Chair/bed transfer assistive device: Armrests, Sliding board (slide board to recliner and bed)     Locomotion Ambulation Ambulation activity did not occur: Safety/medical concerns         Wheelchair   Type: Manual Max wheelchair distance: 300 ft Assist Level: Supervision or verbal cues  Cognition Comprehension Comprehension assist level: Follows complex conversation/direction with no assist  Expression Expression assist level: Expresses complex ideas: With no assist  Social Interaction Social Interaction assist level: Interacts appropriately with others - No medications needed.  Problem Solving Problem solving assist level: Solves complex problems: Recognizes & self-corrects  Memory Memory assist level: Complete Independence: No helper    Medical Problem List and Plan: 1. Decreased functional mobilitysecondary to anterior inferior left C6 vertebral corner fracture as well as widening of C6-7 facet joint with  capsule disruption and ligamentous injury, C8 and T1 nerve root avulsion with right brachial plexus injury, cervical collar at all times, pelvic ring fracture with zone 3 fracture, right SI diastasis as well as left open fibular fracture status post ORIF anterior entrance sacral screw for sacral fracture. Weightbearing as tolerated left lower extremity for transfers only nonweightbearing right lower extremity.  Team conference today please see physician documentation under team conference tab, met with team face-to-face to discuss problems,progress, and goals. Formulized individual treatment plan based on medical history, underlying problem and comorbidities.   2. DVT Prophylaxis/Anticoagulation: Coumadin for DVT prophylaxis.  Vascular studies neg  INR therpauetic 7/10 3. Pain Management: Ultram 50 mg 3 times a day and oxycodone as needed  Gabapentin 300 TID started 7/5, increased to 488m tid 7/9  Robaxin 500 TID started 7/6 4. Mood: Provide emotional support 5. Neuropsych: This patient iscapable of making decisions on hisown behalf. 6. Skin/Wound Care: Skin care as directed 7. 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 Contact precautions 9.Urinary retention. Urecholine 10 mg 3 times a day as well as Flomax 0.4 mg daily.   PVR with some retention, cont meds  Foley d/ced 10.Constipation.Laxative assistance 11.Tobacco abuse. Counseling 12. Hyperglycemia  Likely stress induced  Relatively controlled 13. Hyponatremia  Na+ 131 on 7/9  Cont to monitor   14. Transaminitis  Relatively  stable 7/9  Cont to follow 15. Hypoalbuminemia  Supplement initiated 7/5 16. ABLA  Hb 10.7 on 7/9  Cont to monitor  Labs ordered for tomorrow 17. Leukocytosis  WBCs 21.7 on 7/9  UA ?+, Ucx NG, repeat UA+, Ucx +E coli  CXR showing likely PNA- vanc/zosyn for several days then may be able to switch to po upon D/C- ask for pharm guidance 18. Elevated blood  pressure   Vitals:   04/11/17 1530 04/12/17 0500  BP: 118/63 119/69  Pulse: 94 96  Resp: 18 18  Temp: 98.6 F (37 C) 98.4 F (36.9 C)   19. HCAP  Vanc/Zosyn started 7/10  Repeat CXR recommended in 3-4 weeks 20. UTI ecoli should respond to current abx     LOS (Days) 7 A FACE TO FACE EVALUATION WAS PERFORMED  Charlett Blake 04/12/2017 7:33 AM

## 2017-04-12 NOTE — Progress Notes (Signed)
ANTICOAGULATION CONSULT NOTE - Follow Up Consult  Pharmacy Consult for coumadin Indication: VTE prophylaxis  No Known Allergies  Patient Measurements: Height: 5\' 10"  (177.8 cm) Weight: 187 lb 11.2 oz (85.1 kg) IBW/kg (Calculated) : 73 Heparin Dosing Weight:   Vital Signs: Temp: 98.4 F (36.9 C) (07/11 0500) Temp Source: Oral (07/11 0500) BP: 119/69 (07/11 0500) Pulse Rate: 96 (07/11 0500)  Labs:  Recent Labs  04/10/17 0421 04/11/17 0438  HGB 10.7*  --   HCT 31.4*  --   PLT 445*  --   LABPROT 25.0* 25.5*  INR 2.22 2.28  CREATININE 0.58*  --     Estimated Creatinine Clearance: 119.1 mL/min (A) (by C-G formula based on SCr of 0.58 mg/dL (L)).   Medications:  Scheduled:  . bethanechol  10 mg Oral TID  . feeding supplement (PRO-STAT SUGAR FREE 64)  30 mL Oral BID  . gabapentin  400 mg Oral TID  . methocarbamol  500 mg Oral TID  . pantoprazole  40 mg Oral Daily   Or  . pantoprazole (PROTONIX) IV  40 mg Intravenous Daily  . senna-docusate  1 tablet Oral BID  . tamsulosin  0.4 mg Oral Daily  . traMADol  50 mg Oral TID  . warfarin  5 mg Oral q1800  . Warfarin - Pharmacist Dosing Inpatient   Does not apply q1800   Infusions:  . piperacillin-tazobactam (ZOSYN)  IV 3.375 g (04/12/17 0457)  . vancomycin Stopped (04/12/17 [id number])    Assessment: 46 yo male is currently on therapeutic coumadin for VTE prophylaxis.  INR today is down to 2.09.  Goal of Therapy:  INR 2-3 Monitor platelets by anticoagulation protocol: Yes   Plan:  - change coumadin 7.5 mg po x1 - INR in am  Ricardo Friedman, Ricardo Friedman 04/12/2017,8:04 AM

## 2017-04-12 NOTE — Progress Notes (Signed)
Social Work Patient ID: Waylan Boga, male   DOB: July 18, 1971, 46 y.o.   MRN: [id number]  Met with pt and spoke with Tiffany-girlfriend to inform team conference goals supervision-min assist level and target Discharge 7/14. Discussed family education-Friday @ 3:00 and the discharge needs he will have. Tiffany somewhat stressed due to trying to get everything done-moving pt out of apartment, moving into her home and trying to work at the same time. Will need to start off with home health due to need for a RN then transition to OP therapies once can WB. Work on discharge needs.

## 2017-04-13 ENCOUNTER — Inpatient Hospital Stay (HOSPITAL_COMMUNITY): Payer: Commercial Managed Care - PPO | Admitting: Occupational Therapy

## 2017-04-13 ENCOUNTER — Inpatient Hospital Stay (HOSPITAL_COMMUNITY): Payer: Commercial Managed Care - PPO | Admitting: Physical Therapy

## 2017-04-13 DIAGNOSIS — D72825 Bandemia: Secondary | ICD-10-CM

## 2017-04-13 DIAGNOSIS — S12500D Unspecified displaced fracture of sixth cervical vertebra, subsequent encounter for fracture with routine healing: Secondary | ICD-10-CM

## 2017-04-13 LAB — PROTIME-INR
INR: 2.21
PROTHROMBIN TIME: 24.9 s — AB (ref 11.4–15.2)

## 2017-04-13 MED ORDER — WARFARIN SODIUM 5 MG PO TABS
5.0000 mg | ORAL_TABLET | Freq: Once | ORAL | Status: AC
  Administered 2017-04-13: 5 mg via ORAL
  Filled 2017-04-13: qty 1
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
## 2017-04-13 NOTE — Progress Notes (Signed)
Physical Therapy Session Note  Patient Details  Name: Ricardo Friedman MRN: [id number] Date of Birth: 04/08/1971  Today's Date: 04/13/2017 PT Individual Time: 0900-1000 PT Individual Time Calculation (min): 60 min   Short Term Goals: Week 1:  PT Short Term Goal 1 (Week 1): Pt will perform chair to/from bed transfer, Mod A x1 PT Short Term Goal 2 (Week 1): Pt will be Mod A for rolling R/L PT Short Term Goal 3 (Week 1): Pt will tolerate upright activity for 30+ minutes  PT Short Term Goal 4 (Week 1): Pt will maintain dynamic sitting balance with supervision  PT Short Term Goal 5 (Week 1): Pt will perform 50 ft with Mod A in w/c   Skilled Therapeutic Interventions/Progress Updates:    no c/o of pain at beginning of session with pain during but no number given. PT treatment session focused on LE strengthening and stretching, sitting balance, and activity tolerance.  Pt sitting in w/c upon arrival and agreeable to PT treatment session. Pt performed w/c mobility using L UE and L LE  to/from therapy gym mod-I. Pt performed squat pivot transfers w/c <> mat with supervision and pt able to set up transfer correctly with verbal cues for weight bearing precautions. Pt performed 2x10 LAQs bilaterally with 4lb weight on L LE. Pt performed sit to supine on mat table with supervision for safety and increased time. Pt performed 2x10 of the following LE exercises bilaterally in supine: SAQ with 3lb weight on L LE, and AAROM for SLR with no weight. PT provided verbal and visual cues for form and technique. Pt performed supine <> sit with supervision for safety and increased time to receive medications by RN. PT performed supine hamstring stretch of B LEs 2x30seconds. Pt performed UE reaching task and ball toss sitting edge of mat without LE support on dynadisc to work on dynamic sitting balance. Pt performed w/c mobility using L UE and L LE to room independently and left sitting in w/c with call bell in reach.    Therapy Documentation Precautions:  Precautions Precautions: Fall Required Braces or Orthoses: Sling, Cervical Brace Cervical Brace: At all times Restrictions Weight Bearing Restrictions: Yes RUE Weight Bearing: Weight bearing as tolerated RLE Weight Bearing: Non weight bearing LLE Weight Bearing: Weight bearing as tolerated Other Position/Activity Restrictions: LLE WBAT for transfers only   See Function Navigator for Current Functional Status.   Therapy/Group: Individual Therapy  Posey Jasmin 04/13/2017, 12:10 PM

## 2017-04-13 NOTE — Progress Notes (Signed)
Physical Therapy Session Note  Patient Details  Name: Ricardo Friedman MRN: [id number] Date of Birth: 02-16-1971  Today's Date: 04/13/2017 PT Individual Time: 7014-1030 PT Individual Time Calculation (min): 68 min   Short Term Goals: Week 1:  PT Short Term Goal 1 (Week 1): Pt will perform chair to/from bed transfer, Mod A x1 PT Short Term Goal 1 - Progress (Week 1): Met PT Short Term Goal 2 (Week 1): Pt will be Mod A for rolling R/L PT Short Term Goal 2 - Progress (Week 1): Met PT Short Term Goal 3 (Week 1): Pt will tolerate upright activity for 30+ minutes  PT Short Term Goal 3 - Progress (Week 1): Met PT Short Term Goal 4 (Week 1): Pt will maintain dynamic sitting balance with supervision  PT Short Term Goal 5 (Week 1): Pt will perform 50 ft with Mod A in w/c  PT Short Term Goal 5 - Progress (Week 1): Met  Skilled Therapeutic Interventions/Progress Updates:    no c/o of pain. PT treatment session focused on w/c mobility, LE strengthening and transfers.   Pt performed w/c mobility >1017f mod-I to elevators and down to 2nd floor main entrance and outside over uneven surfaces, doorways, and ramps. Pt performed w/c to recliner lateral scoot transfer using a transfer board mod-I. Pt performed sit to stand using w/c armrests with min guard assist for safety. Pt standing using L UE on hall railing as needed with min assist for balance while reaching for cups with L UE. Pt performed squat pivot transfer from w/c to mat mod-I. Pt participated in L UE boxing in sitting without LE support 3x to fatigue to work on sitting balance, activity tolerance, and UE strengthening. Pt returned to room, left in w/c with call bell in reach and pt approved for mod-I in his room.   Therapy Documentation Precautions:  Precautions Precautions: Fall Required Braces or Orthoses: Sling, Cervical Brace Cervical Brace: At all times Restrictions Weight Bearing Restrictions: Yes RUE Weight Bearing: Non weight  bearing RLE Weight Bearing: Non weight bearing LLE Weight Bearing: Weight bearing as tolerated Other Position/Activity Restrictions: LLE WBAT for transfers only   See Function Navigator for Current Functional Status.   Therapy/Group: Individual Therapy  Shaely Gadberry 04/13/2017, 4:57 PM

## 2017-04-13 NOTE — Progress Notes (Signed)
Occupational Therapy Session Note  Patient Details  Name: Ricardo Friedman MRN: [id number] Date of Birth: 04/19/1971  Today's Date: 04/13/2017 OT Individual Time: 1100-1200 OT Individual Time Calculation (min): 60 min    Short Term Goals: Week 1:  OT Short Term Goal 1 (Week 1): STGs equal to LTGs based on ELOS  Skilled Therapeutic Interventions/Progress Updates:    Pt transferred bed >wc with supervision. Pt required assist to position wc towards tub bench. Discussed positioning options within home bathroom set-up. Pt completed tub bench transfer with supervision. Able to remove pants with leaning method, then utilized hemi technique to doff t shirt with increased time. Bathing completed with overall set-up A and ADL AE. Pt transferred out of shower with set-up/supervision. Pt completed UB and LB dressing at EOB, then bed level for LB dressing using rolling technique. Pt laid flat in supine for c-collar change. Informed pt on education that needs to be completed to sig other;pt agreed. Pt transferred back to wc with supervision and left with lunch set up and needs met.   Therapy Documentation Precautions:  Precautions Precautions: Fall Required Braces or Orthoses: Sling, Cervical Brace Cervical Brace: At all times Restrictions Weight Bearing Restrictions: Yes RUE Weight Bearing: Weight bearing as tolerated RLE Weight Bearing: Non weight bearing LLE Weight Bearing: Weight bearing as tolerated Other Position/Activity Restrictions: LLE WBAT for transfers only Pain: Pain Assessment Pain Assessment: 0-10 Pain Score: 3  Pain Type: Acute pain Pain Location: Arm Pain Orientation: Left Pain Descriptors / Indicators: Tender Pain Onset: With Activity Pain Intervention(s): Repositioned  See Function Navigator for Current Functional Status.   Therapy/Group: Individual Therapy  Valma Cava 04/13/2017, 12:25 PM

## 2017-04-13 NOTE — Progress Notes (Signed)
Physical Therapy Weekly Progress Note  Patient Details  Name: Ricardo Friedman MRN: [id number] Date of Birth: 09/12/1971  Beginning of progress report period: April 06, 2017 End of progress report period: April 13, 2017   Patient has met 5 of 5 short term goals.  Pt has made excellent progress this reporting period and has met all STGs.  Pt currently performing at overall supervision level with min assist for high level w/c skills (grades, thressholds, etc) only.    Patient continues to demonstrate the following deficits muscle weakness and therefore will continue to benefit from skilled PT intervention to increase functional independence with mobility.  Patient progressing toward long term goals..  Plan of care revisions: upgraded goals to supervision>mod I w/c level.  PT Short Term Goals Week 1:  PT Short Term Goal 1 (Week 1): Pt will perform chair to/from bed transfer, Mod A x1 PT Short Term Goal 1 - Progress (Week 1): Met PT Short Term Goal 2 (Week 1): Pt will be Mod A for rolling R/L PT Short Term Goal 2 - Progress (Week 1): Met PT Short Term Goal 3 (Week 1): Pt will tolerate upright activity for 30+ minutes  PT Short Term Goal 3 - Progress (Week 1): Met PT Short Term Goal 4 (Week 1): Pt will maintain dynamic sitting balance with supervision  PT Short Term Goal 5 (Week 1): Pt will perform 50 ft with Mod A in w/c  PT Short Term Goal 5 - Progress (Week 1): Met Week 2:  PT Short Term Goal 1 (Week 2): =LTGs due to ELOS   Therapy Documentation Precautions:  Precautions Precautions: Fall Required Braces or Orthoses: Sling, Cervical Brace Cervical Brace: At all times Restrictions Weight Bearing Restrictions: Yes RUE Weight Bearing: Non weight bearing RLE Weight Bearing: Non weight bearing LLE Weight Bearing: Weight bearing as tolerated Other Position/Activity Restrictions: LLE WBAT for transfers only   See Function Navigator for Current Functional Status.   Ricardo Dicioccio E  Friedman 04/13/2017, 1:50 PM

## 2017-04-13 NOTE — Progress Notes (Signed)
Orthopedic Tech Progress Note Patient Details:  Ricardo RummageRichard Friedman 11/22/1970 [id number]  Patient ID: Ricardo Rummageichard Friedman, male   DOB: 07/21/1971, 46 y.o.   MRN: [id number]   [name], [name] 04/13/2017, 11:34 AM Called in advanced brace order; spoke with [name]
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
## 2017-04-13 NOTE — Progress Notes (Signed)
Patient safety maintained throughout the shift.  Patient denies needs/conerns at this time.

## 2017-04-13 NOTE — Progress Notes (Signed)
Social Work Patient ID: Ricardo Friedman, male   DOB: 01/14/1971, 46 y.o.   MRN: [id number]  Have tried to find a home health agency to take pt's case and provide PT,OT & RN. [facility] can provide PT & OT and PT can draw the INR. RN can show Tiffany tomorrow what he needs for the wound he has they are just covering it with gauze here. Pt will be going back and forth to his Orthro MD for follow up and sutures removal. Spoke with Dan-PA He reports this will be fine. Will see Tiffany tomorrow when here for family education @ 3:00 pm.

## 2017-04-13 NOTE — Progress Notes (Signed)
ANTICOAGULATION CONSULT NOTE - Follow Up Consult  Pharmacy Consult for coumadin Indication: VTE prophylaxis  No Known Allergies  Patient Measurements: Height: 5\' 10"  (177.8 cm) Weight: 187 lb 11.2 oz (85.1 kg) IBW/kg (Calculated) : 73 Heparin Dosing Weight:   Vital Signs: Temp: 98.4 F (36.9 C) (07/12 0500) Temp Source: Oral (07/12 0500) BP: 105/64 (07/12 0500) Pulse Rate: 97 (07/12 0500)  Labs:  Recent Labs  04/11/17 0438 04/12/17 0719 04/13/17 0517  HGB  --  11.7*  --   HCT  --  35.3*  --   PLT  --  637*  --   LABPROT 25.5* 23.8* 24.9*  INR 2.28 2.09 2.21  CREATININE  --  0.59*  --     Estimated Creatinine Clearance: 119.1 mL/min (A) (by C-G formula based on SCr of 0.59 mg/dL (L)).   Medications:  Scheduled:  . bethanechol  10 mg Oral TID  . feeding supplement (PRO-STAT SUGAR FREE 64)  30 mL Oral BID  . gabapentin  400 mg Oral TID  . methocarbamol  500 mg Oral TID  . pantoprazole  40 mg Oral Daily   Or  . pantoprazole (PROTONIX) IV  40 mg Intravenous Daily  . senna-docusate  1 tablet Oral BID  . tamsulosin  0.4 mg Oral Daily  . traMADol  50 mg Oral TID  . warfarin  5 mg Oral ONCE-1800  . Warfarin - Pharmacist Dosing Inpatient   Does not apply q1800   Infusions:  . piperacillin-tazobactam (ZOSYN)  IV 3.375 g (04/13/17 0417)  . vancomycin Stopped (04/13/17 0445)    Assessment: 46 yo male is currently on therapeutic coumadin for VTE prophylaxis.  INR today is up to 2.21 after one dose of coumadin 7.5 mg.  Goal of Therapy:  INR 2-3 Monitor platelets by anticoagulation protocol: Yes   Plan:  - coumadin 5 mg po x1 - INR in am  Sharyl Panchal, Tsz-Yin 04/13/2017,8:06 AM

## 2017-04-14 ENCOUNTER — Inpatient Hospital Stay (HOSPITAL_COMMUNITY): Payer: Commercial Managed Care - PPO

## 2017-04-14 ENCOUNTER — Ambulatory Visit (HOSPITAL_COMMUNITY): Payer: Commercial Managed Care - PPO | Admitting: Physical Therapy

## 2017-04-14 ENCOUNTER — Inpatient Hospital Stay (HOSPITAL_COMMUNITY): Payer: Commercial Managed Care - PPO | Admitting: Physical Therapy

## 2017-04-14 ENCOUNTER — Encounter (HOSPITAL_COMMUNITY): Payer: Commercial Managed Care - PPO | Admitting: Occupational Therapy

## 2017-04-14 LAB — PROTIME-INR
INR: 2.22
Prothrombin Time: 25 seconds — ABNORMAL HIGH (ref 11.4–15.2)

## 2017-04-14 MED ORDER — NITROFURANTOIN MONOHYD MACRO 100 MG PO CAPS
100.0000 mg | ORAL_CAPSULE | Freq: Two times a day (BID) | ORAL | Status: DC
  Administered 2017-04-15: 100 mg via ORAL
  Filled 2017-04-14: qty 1

## 2017-04-14 MED ORDER — NITROFURANTOIN MONOHYD MACRO 100 MG PO CAPS: 100.0000 mg | ORAL_CAPSULE | Freq: Two times a day (BID) | ORAL | 0 refills | Status: DC

## 2017-04-14 MED ORDER — OXYCODONE HCL 5 MG PO TABS: 5.0000 mg | ORAL_TABLET | Freq: Two times a day (BID) | ORAL | 0 refills | Status: DC | PRN

## 2017-04-14 MED ORDER — CIPROFLOXACIN HCL 500 MG PO TABS: 500.0000 mg | ORAL_TABLET | Freq: Two times a day (BID) | ORAL | Status: DC

## 2017-04-14 MED ORDER — TAMSULOSIN HCL 0.4 MG PO CAPS: 0.4000 mg | ORAL_CAPSULE | Freq: Every day | ORAL | 0 refills | Status: DC

## 2017-04-14 MED ORDER — GABAPENTIN 400 MG PO CAPS: 400.0000 mg | ORAL_CAPSULE | Freq: Three times a day (TID) | ORAL | 0 refills | Status: DC

## 2017-04-14 MED ORDER — WARFARIN SODIUM 5 MG PO TABS
5.0000 mg | ORAL_TABLET | Freq: Once | ORAL | Status: AC
  Administered 2017-04-14: 5 mg via ORAL
  Filled 2017-04-14: qty 1

## 2017-04-14 MED ORDER — SENNOSIDES-DOCUSATE SODIUM 8.6-50 MG PO TABS
1.0000 | ORAL_TABLET | Freq: Two times a day (BID) | ORAL | 0 refills | Status: DC
Start: 2017-04-14 — End: 2017-05-04

## 2017-04-14 MED ORDER — METHOCARBAMOL 500 MG PO TABS: 500.0000 mg | ORAL_TABLET | Freq: Three times a day (TID) | ORAL | 0 refills | Status: DC

## 2017-04-14 MED ORDER — BETHANECHOL CHLORIDE 10 MG PO TABS: 10.0000 mg | ORAL_TABLET | Freq: Three times a day (TID) | ORAL | 0 refills | Status: DC

## 2017-04-14 MED ORDER — TRAMADOL HCL 50 MG PO TABS: 50.0000 mg | ORAL_TABLET | Freq: Three times a day (TID) | ORAL | 0 refills | Status: DC

## 2017-04-14 MED ORDER — WARFARIN SODIUM 5 MG PO TABS: 5.0000 mg | ORAL_TABLET | Freq: Every day | ORAL | 0 refills | Status: DC

## 2017-04-14 MED ORDER — PANTOPRAZOLE SODIUM 40 MG PO TBEC: 40.0000 mg | DELAYED_RELEASE_TABLET | Freq: Every day | ORAL | 0 refills | Status: DC

## 2017-04-14 NOTE — Progress Notes (Signed)
Pharmacy Antibiotic Note  Annamary RummageRichard Urquilla is a 46 y.o. male admitted on 04/05/2017 with pneumonia.  Pharmacy has been consulted for vancomycin and zosyn dosing.  Vancomycin trough 15-20. Patients has refused 2 doses in the last 2 days. SCr stable, CrCl > 14400ml/min. Urine cx grew ESBL but unsure if had any urinary symptoms. CXR shows possible PNA.   Plan: Continue vancomycin 1g IV Q8h Continue Zosyn 3.375 gm IV q8h (4 hour infusion) Monitor clinical picture, renal function, VT prn F/U C&S, abx deescalation / LOT  Consider de-escalating to Levaquin soon to complete course for PNA / UTI?  Height: 5\' 10"  (177.8 cm) Weight: 187 lb 11.2 oz (85.1 kg) IBW/kg (Calculated) : 73  Temp (24hrs), Avg:98.6 F (37 C), Min:98.4 F (36.9 C), Max:98.8 F (37.1 C)   Recent Labs Lab 04/10/17 0421 04/12/17 0719  WBC 21.7* 12.5*  CREATININE 0.58* 0.59*    Estimated Creatinine Clearance: 119.1 mL/min (A) (by C-G formula based on SCr of 0.59 mg/dL (L)).    No Known Allergies   Thank you for allowing pharmacy to be a part of this patient's care.  Armandina StammerBATCHELDER,Arthea Nobel J 04/14/2017 2:49 AM

## 2017-04-14 NOTE — Progress Notes (Signed)
Occupational Therapy Discharge Summary  Patient Details  Name: Ricardo Friedman MRN: [id number] Date of Birth: 02/02/1971  Today's Date: 04/14/2017 OT Individual Time: 1500-1530 OT Individual Time Calculation (min): 30 min   OT treatment session focused on pt/family education regarding BADL. Pt demonstrated supervision transfer in and out of tub shower using tub transfer bench. Discussed home bathroom set-up and simulated transfer as close to home as possible. Provided pt and significant other with Giv-Mohr sling brochure and discussed how to don/doff sling for proper fit. Pt then transferred back to bed and OT educated significant other on c-spine precautions and how to don/doff c-collar for pad replacement s/p shower. Significant other demonstrated understanding and was able to don/doff c-collar and change pads without difficulty. Pt left seated EOB with needs met and questions answered. Pt ready for dc from and OT perspective.   Patient has met 10 of 10 long term goals due to improved activity tolerance, improved balance, postural control, ability to compensate for deficits and functional use of  RIGHT upper extremity.  Patient to discharge at overall Modified Independent/supervision level.  Patient's care partner is independent to provide the necessary physical assistance at discharge.    Reasons goals not met: n/a   Recommendation:  Patient will benefit from ongoing skilled OT services in home health setting to continue to advance functional skills in the area of BADL and iADL.  Equipment: drop arm BSC, tub transfer bench  Reasons for discharge: treatment goals met and discharge from hospital  Patient/family agrees with progress made and goals achieved: Yes  OT Discharge Precautions/Restrictions  Precautions Precautions: Fall Precaution Comments: . Required Braces or Orthoses: Cervical Brace;Sling Cervical Brace: At all times Restrictions Weight Bearing Restrictions: Yes RUE Weight  Bearing: Weight bearing as tolerated RLE Weight Bearing: Non weight bearing LLE Weight Bearing: Weight bearing as tolerated Other Position/Activity Restrictions: LLE WBAT for transfers only Pain Pain Assessment Pain Assessment: 0-10 Pain Score: 2  Faces Pain Scale: Hurts a little bit Pain Type: Acute pain Pain Location: Pelvis Pain Orientation: Posterior Pain Descriptors / Indicators: Aching Pain Onset: With Activity Pain Intervention(s): Repositioned ADL ADL Eating: Set up Grooming: Modified independent Upper Body Bathing: Supervision/safety Lower Body Bathing: Supervision/safety Upper Body Dressing: Modified independent (Device) Lower Body Dressing: Modified independent Toileting: Modified independent Toilet Transfer: Modified independent Tub/Shower Transfer: Distant supervision Tub/Shower Transfer Method:  (lateral transfer) ADL Comments: Please see functional navigator Perception  Perception: Within Functional Limits Praxis Praxis: Intact Cognition Overall Cognitive Status: Within Functional Limits for tasks assessed Arousal/Alertness: Awake/alert Orientation Level: Oriented X4 Sensation Sensation Light Touch: Impaired Detail Light Touch Impaired Details: Impaired RUE Hot/Cold: Impaired Detail Hot/Cold Impaired Details: Absent RUE Proprioception: Impaired Detail Proprioception Impaired Details: Impaired RUE Additional Comments: Pt with intact proprioception in the RUE at the elbow and shoulder but more impaired at the wrist and in the digits  Coordination Gross Motor Movements are Fluid and Coordinated: No Fine Motor Movements are Fluid and Coordinated: No Motor  Motor Motor: Other (comment) Motor - Skilled Clinical Observations: 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   Trunk/Postural Assessment  Cervical Assessment Cervical Assessment: Within Functional Limits Cervical AROM Overall Cervical AROM Comments: AROM NT secondary to cervical collar   Cervical PROM Overall Cervical PROM Comments: PROM NT secondary to cervical collar  Thoracic Assessment Thoracic Assessment: Within Functional Limits Lumbar Assessment Lumbar Assessment: Within Functional Limits Postural Control Postural Control: Within Functional Limits  Balance Balance Balance Assessed: Yes Static Sitting Balance Static Sitting - Balance Support:  No upper extremity supported;Feet unsupported Static Sitting - Level of Assistance: 7: Independent Dynamic Sitting Balance Dynamic Sitting - Balance Support: Feet supported Dynamic Sitting - Level of Assistance: 7: Independent Extremity/Trunk Assessment RUE Assessment RUE Assessment: Exceptions to [facility] RUE Tone RUE Tone Comments: Flaccid R UE 2/2 brachial plexus injury, pt does have shoulder, scap, and some pectoral activation noted LUE Assessment LUE Assessment: Within Functional Limits   See Function Navigator for Current Functional Status.  Daneen Schick Teyonna Plaisted 04/14/2017, 4:07 PM

## 2017-04-14 NOTE — Discharge Summary (Signed)
Discharge summary job # [id number]
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
## 2017-04-14 NOTE — Progress Notes (Signed)
Physical Therapy Session Note  Patient Details  Name: Ricardo RummageRichard Friedman MRN: [id number] Date of Birth: 01/05/1971  Today's Date: 04/14/2017 PT Individual Time: [id number] PT Individual Time Calculation (min): 45 min   Short Term Goals: Week 2:  PT Short Term Goal 1 (Week 2): =LTGs due to ELOS  Skilled Therapeutic Interventions/Progress Updates:    Pt supine in bed upon arrival and agreeable to PT treatment session. Pt transferred from supine to sitting EOB Mod I and transferred from bed>w/c with supervision. Pt performed w/c mobility using L UE and L LE  to/from therapy gym mod-I. Pt performed squat pivot transfers w/c <> mat with supervision and pt able to set up transfer correctly with verbal cues for weight bearing precautions. Pt performed 2x10 LAQs bilaterally. Pt performed 2x10 of the following LE exercises bilaterally in supine: SAQ, and AAROM for SLR with no weight, hip abd/adduction AROM, and heel slides. Hip extensor stretch and piriformis stretch 2 x 30 sec. Sitting edgo of mat on dynadisc working on dynamic sitting balance with supervision hitting a ball back and forth for reaching outside base of support. Pt transferred from mat to w/c squat pivot, supervision and verbal cues for precautions. In the w/c worked on navigating obstacles going around cones and going up a ramp mod I. Pt left in w/c in room with needs in reach.   Therapy Documentation Precautions:  Precautions Precautions: Fall Required Braces or Orthoses: Sling, Cervical Brace Cervical Brace: At all times Restrictions Weight Bearing Restrictions: Yes RUE Weight Bearing: Weight bearing as tolerated RLE Weight Bearing: Non weight bearing LLE Weight Bearing: Weight bearing as tolerated Other Position/Activity Restrictions: LLE WBAT for transfers only Pain: Pain Assessment Pain Assessment: 0-10 Pain Score: 2    See Function Navigator for Current Functional Status.   Therapy/Group: Individual Therapy  Cresenciano GenreEmily van  Schagen, PT, DPT 04/14/2017, 8:23 AM

## 2017-04-14 NOTE — Progress Notes (Signed)
Physical Therapy Discharge Summary  Patient Details  Name: Ricardo Friedman MRN: [id number] Date of Birth: 1971-04-27  Today's Date: 04/14/2017 PT Individual Time:1000-1100 and 8938-1017 PT Individual Time Calculation (min): 60 min and 45 min    Patient has met 7 of 7 long term goals due to improved activity tolerance, improved balance, improved postural control, increased strength, increased range of motion, decreased pain and improved coordination.  Patient to discharge at a wheelchair level Modified Independent.   Patient's care partner is independent to provide the necessary IADL and community mobility assistance at discharge.  Recommendation:  Patient will benefit from ongoing skilled PT services in home health setting to continue to advance safe functional mobility, address ongoing impairments in LE weakness, LE flexibility, R UE proximal stability, core strengthening, and minimize fall risk.  Equipment: wheelchair and transfer board  Reasons for discharge: treatment goals met and discharge from hospital  Patient/family agrees with progress made and goals achieved: Yes   Skilled PT Intervention:  Tx1: Pt c/o of 4/10 pain at beginning of session. PT treatment session focused on dynamic sitting balance and transfers.  Pt performed supine to sit without bed rails and HOB flat to simulate home environment independently with increased time required. Pt performed squat pivot transfer from bed to w/c mod-I. Pt performed bed to [facility] transfer mod-I with pt demonstrating correct use of drop arms on commode. Pt performed w/c mobility 152f to/from therapy gym mod-I. Pt performed squat pivot transfer w/c to car with supervision for safety. Pt participated in dynamic sitting balance on dynadisc without LE support working on trunk control and cross body reaching during L UE reaching task and ball toss. Pt educated on importance of continuing HEP prescription upon discharge. Pt returned to room left in  w/c with call bell in reach.  Tx2: no c/o of pain. PT treatment session focused on car transfer and w/c mobility and navigation.  Pt sitting in w/c with girlfriend, Ricardo Friedman present and pt agreeable to PT treatment session. Pt and caregiver education on pt's CLOF and anticipated intermittent supervision needed at home. PT demonstrated bumping w/c up/down curb with verbal instructions during demonstration. Caregiver demonstrated understanding with supervision for safety. Pt performed >10037fw/c mobility to/from hospital main entrance mod-I. Pt performed lateral scoot transfer using transfer board from w/c to/from pt's car mod-I. Pt returned to room left sitting in w/c with call bell in reach.   PT Discharge Precautions/Restrictions Precautions Precautions: Fall Precaution Comments: . Required Braces or Orthoses: Cervical Brace;Sling Cervical Brace: At all times Restrictions Weight Bearing Restrictions: Yes RUE Weight Bearing: Weight bearing as tolerated RLE Weight Bearing: Non weight bearing LLE Weight Bearing: Weight bearing as tolerated Other Position/Activity Restrictions: LLE WBAT for transfers only Pain Pain Assessment Pain Assessment: 0-10 Pain Score: 2  Faces Pain Scale: Hurts a little bit Pain Type: Acute pain Pain Location: Pelvis Pain Orientation: Posterior Pain Descriptors / Indicators: Aching Pain Onset: With Activity Pain Intervention(s): Repositioned Vision/Perception  Perception Perception: Within Functional Limits Praxis Praxis: Intact  Cognition Overall Cognitive Status: Within Functional Limits for tasks assessed Arousal/Alertness: Awake/alert Orientation Level: Oriented X4 Sensation Sensation Light Touch: Impaired Detail Light Touch Impaired Details: Impaired RUE Hot/Cold: Impaired Detail Hot/Cold Impaired Details: Absent RUE Proprioception: Impaired Detail Proprioception Impaired Details: Impaired RUE Additional Comments: Pt with intact  proprioception in the RUE at the elbow and shoulder but more impaired at the wrist and in the digits  Coordination Gross Motor Movements are Fluid and Coordinated: No Fine Motor Movements are  Fluid and Coordinated: No Motor  Motor Motor: Other (comment) Motor - Skilled Clinical Observations: 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   Mobility Transfers Transfers: Yes Squat Pivot Transfers: 6: Modified independent (Device/Increase time);With upper extremity assistance;With armrests Lateral/Scoot Transfers: 6: Modified independent (Device/Increase time);With slide board Lateral/Scoot Transfer Details: Tactile cues for weight shifting;Verbal cues for precautions/safety Locomotion     Trunk/Postural Assessment  Cervical Assessment Cervical Assessment: Within Functional Limits Cervical AROM Overall Cervical AROM Comments: AROM NT secondary to cervical collar  Cervical PROM Overall Cervical PROM Comments: PROM NT secondary to cervical collar  Thoracic Assessment Thoracic Assessment: Within Functional Limits Lumbar Assessment Lumbar Assessment: Within Functional Limits Postural Control Postural Control: Within Functional Limits  Balance Balance Balance Assessed: Yes Static Sitting Balance Static Sitting - Balance Support: No upper extremity supported;Feet unsupported Static Sitting - Level of Assistance: 7: Independent Dynamic Sitting Balance Dynamic Sitting - Balance Support: Feet supported Dynamic Sitting - Level of Assistance: 7: Independent Extremity Assessment  RUE Assessment RUE Assessment: Exceptions to [facility] RUE Tone RUE Tone Comments: Flaccid R UE 2/2 brachial plexus injury, pt does have shoulder, scap, and some pectoral activation noted LUE Assessment LUE Assessment: Within Functional Limits RLE Assessment RLE Assessment: Exceptions to [facility] RLE AROM (degrees) RLE Overall AROM Comments: WFL  RLE Strength RLE Overall Strength Comments: Not tested due to  weight bearing restrictions, but pt able to lift against gravity. Roughly 3/5 overall.      See Function Navigator for Current Functional Status.  Ricardo Friedman 04/14/2017, 4:48 PM

## 2017-04-14 NOTE — Progress Notes (Signed)
[place] PHYSICAL MEDICINE & REHABILITATION     PROGRESS NOTE  Subjective/Complaints:  Pt has had no cough or SOB Discussed abx  ROS: Denies nausea, vomiting, diarrhea, shortness of breath or chest pain   Objective: Vital Signs: Blood pressure (!) 106/59, pulse 88, temperature 98.7 F (37.1 C), temperature source Oral, resp. rate 16, height 5\' 10"  (1.778 m), weight 85.1 kg (187 lb 11.2 oz), SpO2 97 %. No results found.  Recent Labs  04/12/17 0719  WBC 12.5*  HGB 11.7*  HCT 35.3*  PLT 637*    Recent Labs  04/12/17 0719  NA 134*  K 3.7  CL 101  GLUCOSE 105*  BUN 15  CREATININE 0.59*  CALCIUM 9.2   CBG (last 3)  No results for input(s): GLUCAP in the last 72 hours.  Wt Readings from Last 3 Encounters:  04/05/17 85.1 kg (187 lb 11.2 oz)  03/30/17 95.5 kg (210 lb 8.6 oz)    Physical Exam:  BP (!) 106/59 (BP Location: Left Arm)   Pulse 88   Temp 98.7 F (37.1 C) (Oral)   Resp 16   Ht 5\' 10"  (1.778 m)   Wt 85.1 kg (187 lb 11.2 oz)   SpO2 97%   BMI 26.93 kg/m  Constitutional: He appears well-developed. NAD.  HENT: Normocephalic. Healing abrasions to the forehead Eyes: EOMare normal. No discharge.   Neck: Cervical collar in place Cardiovascular: RRR. No JVD. Respiratory: CTA. Normal effort  GI: Soft. Bowel sounds are normal.  Musculoskeletal: He exhibits edema and tenderness, improving.  Neurological: He is alertand oriented.  Motor: RUE: Shoulder abduction 1+/5, distally 0/5  Sensation absent to light touch in RUE  LUE: 5/5 proximal to distal B/l LE: HF 3-/5, KE 3/5, ADF/PF 4/5 (pain inhibition).  Psychiatric: Normal mood and behavior Skin. Left lower extremity wound dressed. Pelvic wound clean and intact  Assessment/Plan: 1. Functional deficits secondary to polytrauma which require 3+ hours per day of interdisciplinary therapy in a comprehensive inpatient rehab setting. Physiatrist is providing close team supervision and 24 hour management of  active medical problems listed below. Physiatrist and rehab team continue to assess barriers to discharge/monitor patient progress toward functional and medical goals.  Function:  Bathing Bathing position   Position: Shower  Bathing parts Body parts bathed by patient: Right arm, Left arm, Chest, Abdomen, Front perineal area, Left upper leg, Right upper leg, Buttocks, Left lower leg, Right lower leg, Back Body parts bathed by helper: Right lower leg, Left lower leg, Buttocks, Left arm, Back  Bathing assist Assist Level: Supervision or verbal cues, Set up, Assistive device Assistive Device Comment: long-handled sponge Set up : To obtain items  Upper Body Dressing/Undressing Upper body dressing   What is the patient wearing?: Pull over shirt/dress     Pull over shirt/dress - Perfomed by patient: Thread/unthread right sleeve, Thread/unthread left sleeve, Put head through opening, Pull shirt over trunk Pull over shirt/dress - Perfomed by helper: Put head through opening, Pull shirt over trunk        Upper body assist Assist Level: More than reasonable time   Set up : To obtain clothing/put away  Lower Body Dressing/Undressing Lower body dressing   What is the patient wearing?: Pants, Non-skid slipper socks     Pants- Performed by patient: Thread/unthread left pants leg, Thread/unthread right pants leg, Pull pants up/down Pants- Performed by helper: Pull pants up/down Non-skid slipper socks- Performed by patient: Don/doff right sock, Don/doff left sock Non-skid slipper socks- Performed by  helper: Don/doff right sock, Don/doff left sock                  Lower body assist Assist for lower body dressing: Set up, More than reasonable time, Assistive device Assistive Device Comment: sock aid and reacher Set up : To obtain clothing/put away  Toileting Toileting   Toileting steps completed by patient: Adjust clothing prior to toileting, Performs perineal hygiene, Adjust clothing  after toileting Toileting steps completed by helper: Adjust clothing prior to toileting Toileting Assistive Devices: Grab bar or rail  Toileting assist Assist level: Supervision or verbal cues   Transfers Chair/bed transfer   Chair/bed transfer method: Squat pivot (chair to mat) Chair/bed transfer assist level: Supervision or verbal cues Chair/bed transfer assistive device: Armrests     Locomotion Ambulation Ambulation activity did not occur: Safety/medical concerns         Wheelchair   Type: Manual Max wheelchair distance: [id number] Assist Level: No help, No cues, assistive device, takes more than reasonable amount of time  Cognition Comprehension Comprehension assist level: Follows complex conversation/direction with no assist  Expression Expression assist level: Expresses complex ideas: With no assist  Social Interaction Social Interaction assist level: Interacts appropriately with others - No medications needed.  Problem Solving Problem solving assist level: Solves complex problems: Recognizes & self-corrects  Memory Memory assist level: Complete Independence: No helper    Medical Problem List and Plan: 1. Decreased functional mobilitysecondary to anterior inferior left C6 vertebral corner fracture as well as widening of C6-7 facet joint with capsule disruption and ligamentous injury, C8 and T1 nerve root avulsion with right brachial plexus injury, cervical collar at all times, pelvic ring fracture with zone 3 fracture, right SI diastasis as well as left open fibular fracture status post ORIF anterior entrance sacral screw for sacral fracture. Weightbearing as tolerated left lower extremity for transfers only nonweightbearing right lower extremity.  D/C in am F/u ortho 1 wk PMR in 3-4 wk   2. DVT Prophylaxis/Anticoagulation: Coumadin for DVT prophylaxis.  Vascular studies neg  INR therpauetic 7/11 3. Pain Management: Ultram 50 mg 3 times a day and oxycodone as  needed  Gabapentin 300 TID started 7/5, increased to 400mg  tid 7/9  Robaxin 500 TID started 7/6 4. Mood: Provide emotional support 5. Neuropsych: This patient iscapable of making decisions on hisown behalf. 6. Skin/Wound Care: Skin care as directed 7. 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 Contact precautions 9.Urinary retention. Urecholine 10 mg 3 times a day as well as Flomax 0.4 mg daily.   PVR with some retention, cont meds  Foley d/ced 10.Constipation.Laxative assistance 11.Tobacco abuse. Counseling 12. Hyperglycemia  Likely stress induced  Relatively controlled 13. Hyponatremia  Na+ 131 on 7/9  Cont to monitor   14. Transaminitis  Relatively stable 7/9  Cont to follow 15. Hypoalbuminemia  Supplement initiated 7/5 16. ABLA  Hb 10.7 on 7/9  Cont to monitor  Labs ordered for tomorrow 17. Leukocytosis  WBCs 12.5 on 7/11, down considerably from 21K  UA ?+, Ucx NG, repeat UA+, Ucx +E coli  CXR showing likely PNA- vanc/zosyn for several days then may be able to switch to po upon D/C- ask for pharm guidance 18.  Now normotensive   Vitals:   04/13/17 1426 04/14/17 0600  BP: 113/62 (!) 106/59  Pulse: 100 88  Resp: 18 16  Temp: 98.8 F (37.1 C) 98.7 F (37.1 C)   19. HCAP?-  occ wheeze on RIght CXR was AP only, would do  a 2V chest to further eval  Vanc/Zosyn started 7/10  Repeat CXR recommended in 3-4 weeks 20. UTI ecoli should respond to current abx- may be able to switch to Cipro for d/c to complete tx, start tomorrow 500mg  BID for 3 d     LOS (Days) 9 A FACE TO FACE EVALUATION WAS PERFORMED  Erick Colace 04/14/2017 6:50 AM

## 2017-04-14 NOTE — Discharge Instructions (Signed)
Inpatient Rehab Discharge Instructions  Ricardo Friedman Discharge date and time: 04/15/17   Activities/Precautions/ Functional Status: Activity: Nonweightbearing right lower extremity and cervical collar at all times Diet: regular diet Wound Care: keep wound clean and dry   Functional status:  ___ No restrictions     ___ Walk up steps independently ___ 24/7 supervision/assistance   ___ Walk up steps with assistance ___ Intermittent supervision/assistance  ___ Bathe/dress independently ___ Walk with walker     _x__ Bathe/dress with assistance ___ Walk Independently    ___ Shower independently ___ Walk with assistance    ___ Shower with assistance __X_ No alcohol     ___ Return to work/school ________  Special Instructions: 1. Home health nurse to check INR 04/18/2017  with  results to Dr. Myrene Galas [phone] fax number [phone] 2.  Need follow-up chest x-ray 3-4 weeks    COMMUNITY REFERRALS UPON DISCHARGE:    Home Health:   PT & OT  (PT TO CHECK INR)  Agency:[facility]    Phone: [phone]   Date of last service:04/15/2017   Medical Equipment/Items Ordered:WHEELCHAIR, DROP-ARM BEDSIDE COMMODE, TUB BENCH & 30 TRANSFER BOARD  Agency/Supplier:[facility]   [phone]    Opioid Overdose Opioids are substances that relieve pain by binding to pain receptors in your brain and spinal cord. Opioids include illegal drugs, such as heroin, as well as prescription pain medicines.An opioid overdose happens when you take too much of an opioid substance. This can happen with any type of opioid, including:  Heroin.  Morphine.  Codeine.  Methadone.  Oxycodone.  Hydrocodone.  Fentanyl.  Hydromorphone.  Buprenorphine.  The effects of an overdose can be mild, dangerous, or even deadly. Opioid overdose is a medical emergency. What are the causes? This condition may be caused by:  Taking too much of an opioid by accident.  Taking too much of an opioid on  purpose.  An error made by a health care provider who prescribes a medicine.  An error made by the pharmacist who fills the prescription order.  Using more than one substance that contains opioids at the same time.  Mixing an opioid with a substance that affects your heart, breathing, or blood pressure. These include alcohol, tranquilizers, sleeping pills, illegal drugs, and some over-the-counter medicines.  What increases the risk? This condition is more likely in:  Children. They may be attracted to colorful pills. Because of a child's small size, even a small amount of a drug can be dangerous.  Elderly people. They may be taking many different drugs. Elderly people may have difficulty reading labels or remembering when they last took their medicine.  People who take an opioid on a long-term basis.  People who use: ? Illegal drugs. ? Other substances, including alcohol, while using an opioid.  People who have: ? A history of drug or alcohol abuse. ? Certain mental health conditions.  People who take opioids that are not prescribed for them.  What are the signs or symptoms? Symptoms of this condition depend on the type of opioid and the amount that was taken. Common symptoms include:  Sleepiness or difficulty waking from sleep.  Confusion.  Slurred speech.  Slowed breathing and a slow pulse.  Nausea and vomiting.  Abnormally small pupils.  Signs and symptoms that require emergency treatment include:  Cold, clammy, and pale skin.  Blue lips and fingernails.  Vomiting.  Gurgling sounds in the throat.  A pulse that is very slow or difficult to detect.  Breathing that is very slow, noisy, or difficult to detect.  Limp body.  Inability to respond to speech or be awakened from sleep (stupor).  How is this diagnosed? This condition is diagnosed based on your symptoms. It is important to tell your health care provider:  All of the opioidsthat you  took.  When you took the opioids.  Whether you were drinking alcohol or using other substances.  Your health care provider will do a physical exam. This exam may include:  Checking and monitoring your heart rate and rhythm, your breathing rate and depth, your temperature, and your blood pressure (vital signs).  Checking for abnormally small pupils.  Measuring oxygen levels in your blood.  You may also have blood tests or urine tests. How is this treated? Supporting your vital signs and your breathing is the first step in treating an opioid overdose. Treatment may also include:  Giving fluids and minerals (electrolytes) through an IV tube.  Inserting a breathing tube (endotracheal tube) in your airway to help you breathe.  Giving oxygen.  Passing a tube through your nose and into your stomach (NG tube, or nasogastric tube) to wash out your stomach.  Giving medicines that: ? Increase your blood pressure. ? Absorb any opioid that is in your digestive system. ? Reverse the effects of the opioid (naloxone).  Ongoing counseling and mental health support if you intentionally overdosed or used an illegal drug.  Follow these instructions at home:  Take over-the-counter and prescription medicines only as told by your health care provider. Always ask your health care provider about possible side effects and interactions of any new medicine that you start taking.  Keep a list of all of the medicines that you take, including over-the-counter medicines. Bring this list with you to all of your medical visits.  Drink enough fluid to keep your urine clear or pale yellow.  Keep all follow-up visits as told by your health care provider. This is important. How is this prevented?  Get help if you are struggling with: ? Alcohol or drug use. ? Depression or another mental health problem.  Keep the phone number of your local poison control center near your phone or on your cell  phone.  Store all medicines in safety containers that are out of the reach of children.  Read the drug inserts that come with your medicines.  Do not drink alcohol when taking opioids.  Do not use illegal drugs.  Do not take opioid medicines that are not prescribed for you. Contact a health care provider if:  Your symptoms return.  You develop new symptoms or side effects when you are taking medicines. Get help right away if:  You think that you or someone else may have taken too much of an opioid. The hotline of the [facility] is 253-744-2082.  You or someone else is having symptoms of an opioid overdose.  You have serious thoughts about hurting yourself or others.  You have: ? Chest pain. ? Difficulty breathing. ? A loss of consciousness. Opioid overdose is an emergency. Do not wait to see if the symptoms will go away. Get medical help right away. Call your local emergency services (911 in the [place] Do not drive yourself to the hospital. This information is not intended to replace advice given to you by your health care provider. Make sure you discuss any questions you have with your health care provider. Document Released: 10/27/2004 Document Revised: 02/25/2016 Document Reviewed: 03/05/2015 Elsevier  Interactive Patient Education  [employer]2018 [employer]    My questions have been answered and I understand these instructions. I will adhere to these goals and the provided educational materials after my discharge from the hospital.  Patient/Caregiver Signature _______________________________ Date __________  Clinician Signature _______________________________________ Date __________  Please bring this form and your medication list with you to all your follow-up doctor's appointments.

## 2017-04-14 NOTE — Progress Notes (Signed)
Social Work  Discharge Note  The overall goal for the admission was met for:   Discharge location: Yes-HOME TO [facility]-24 HR SUPERVISION  Length of Stay: Yes-10 DAYS  Discharge activity level: Yes-SUPERVISION-MIN ASSIST WHEELCHAIR LEVEL  Home/community participation: Yes  Services provided included: MD, RD, PT, OT, RN, CM, TR, Pharmacy and SW  Financial Services: Private Insurance: [place]  Follow-up services arranged: Home Health: [facility], DME: [facility]-WHEELCHAIR, [place] and Patient/Family has no preference for HH/DME agencies  Comments (or additional information):[facility] [place]. PT WILL HAVE SOMEONE WITH HIM 24 HR. PT REFUSING IV ANTIBIOTICS FEELS NO NEED FOR THEM. RN AND MD SPOKE WITH HIM ABOUT THE IMPORTANCE OF GETTING THEM. PT IS CAPABLE OF MAKING HIS OWN DECISIONS.  Patient/Family verbalized understanding of follow-up arrangements: Yes  Individual responsible for coordination of the follow-up plan: SELF & [facility]  Confirmed correct DME delivered: Elease Hashimoto 04/14/2017    Elease Hashimoto

## 2017-04-14 NOTE — Progress Notes (Signed)
ANTICOAGULATION CONSULT NOTE - Follow Up Consult  Pharmacy Consult for coumadin Indication: VTE prophylaxis  No Known Allergies  Patient Measurements: Height: 5\' 10"  (177.8 cm) Weight: 187 lb 11.2 oz (85.1 kg) IBW/kg (Calculated) : 73 Heparin Dosing Weight:   Vital Signs: Temp: 98.7 F (37.1 C) (07/13 0600) Temp Source: Oral (07/13 0600) BP: 106/59 (07/13 0600) Pulse Rate: 88 (07/13 0600)  Labs:  Recent Labs  04/12/17 0719 04/13/17 0517 04/14/17 0511  HGB 11.7*  --   --   HCT 35.3*  --   --   PLT 637*  --   --   LABPROT 23.8* 24.9* 25.0*  INR 2.09 2.21 2.22  CREATININE 0.59*  --   --     Estimated Creatinine Clearance: 119.1 mL/min (A) (by C-G formula based on SCr of 0.59 mg/dL (L)).   Medications:  Scheduled:  . bethanechol  10 mg Oral TID  . feeding supplement (PRO-STAT SUGAR FREE 64)  30 mL Oral BID  . gabapentin  400 mg Oral TID  . methocarbamol  500 mg Oral TID  . [START ON 04/15/2017] nitrofurantoin (macrocrystal-monohydrate)  100 mg Oral Q12H  . pantoprazole  40 mg Oral Daily   Or  . pantoprazole (PROTONIX) IV  40 mg Intravenous Daily  . senna-docusate  1 tablet Oral BID  . tamsulosin  0.4 mg Oral Daily  . traMADol  50 mg Oral TID  . Warfarin - Pharmacist Dosing Inpatient   Does not apply q1800   Infusions:  . piperacillin-tazobactam (ZOSYN)  IV Stopped (04/14/17 1215)  . vancomycin Stopped (04/14/17 0715)    Assessment: 46 yo male is currently on therapeutic coumadin for VTE prophylaxis.  INR today is 2.22.   Goal of Therapy:  INR 2-3 Monitor platelets by anticoagulation protocol: Yes   Plan:  - coumadin 5 mg po x1 - INR in am  Bayard HuggerMei Perrin Gens, PharmD, BCPS  Clinical Pharmacist  Pager: [phone]   04/14/2017,1:05 PM

## 2017-04-14 NOTE — Progress Notes (Addendum)
Patient given Rx for ultram 50 mg # 60 pills and Oxycodone 5 mg # 30 pills every 12 hours prn. Discharged on 5 mg daily with [facility] results to Dr. Carola FrostHandy

## 2017-04-15 LAB — PROTIME-INR
INR: 2.11
PROTHROMBIN TIME: 24 s — AB (ref 11.4–15.2)

## 2017-04-15 NOTE — Progress Notes (Signed)
Patient ID: Ricardo Friedman, male   DOB: 10/01/1971, 46 y.o.   MRN: [id number]   04/15/17.  Ricardo Friedman is a 46 y.o. male admit for CIR with Decreased functional mobilitysecondary to anterior inferior left C6 vertebral corner fracture as well as widening of C6-7 facet joint with capsule disruption and ligamentous injury, C8 and T1 nerve root avulsion with right brachial plexus injury, cervical collar at all times, pelvic ring fracture with zone 3 fracture, right SI diastasis as well as left open fibular fracture status post ORIF anterior entrance sacral screw for sacral fracture.  Past Medical History:  Diagnosis Date  . 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 Closed sacral fracture (HCC) 03/31/2017  . Left fibular fracture 03/31/2017  . Multiple open anterior-posterior compression fractures of pelvis with unstable pelvic ring (HCC) 03/31/2017  . Wound of left leg 03/31/2017    Intake/Output Summary (Last 24 hours) at 04/15/17 0900 Last data filed at 04/15/17 0536  Gross per 24 hour  Intake              720 ml  Output              700 ml  Net               20 ml      Subjective: No new complaints. No new problems. Slept well. Feeling OK. And ready for d/c. Objective: Vital signs in last 24 hours: Temp:  [98 F (36.7 C)-98.6 F (37 C)] 98 F (36.7 C) (07/14 0535) Pulse Rate:  [75-106] 75 (07/14 0535) Resp:  [18] 18 (07/14 0535) BP: (111-119)/(62-74) 111/62 (07/14 0535) SpO2:  [97 %-99 %] 97 % (07/14 0535) Weight change:  Last BM Date: 04/12/17 (per patient)  Intake/Output from previous day: 07/13 0701 - 07/14 0700 In: 1200 [P.O.:1200] Out: 700 [Urine:200; Blood:500] Last cbgs: CBG (last 3)  No results for input(s): GLUCAP in the last 72 hours.   Physical Exam General: No apparent distress  C Collar in place HEENT: not dry Lungs: Normal effort. Lungs clear to auscultation, no crackles or wheezes. Cardiovascular: Regular rate and rhythm, no edema Abdomen: S/NT/ND;  BS(+) Musculoskeletal:  unchanged Neurological: No new neurological deficits Wounds: C Collar in place  Skin: clear Mental state: Alert, oriented, cooperative    Lab Results: BMET    Component Value Date/Time   NA 134 (L) 04/12/2017 0719   K 3.7 04/12/2017 0719   CL 101 04/12/2017 0719   CO2 25 04/12/2017 0719   GLUCOSE 105 (H) 04/12/2017 0719   BUN 15 04/12/2017 0719   CREATININE 0.59 (L) 04/12/2017 0719   CALCIUM 9.2 04/12/2017 0719   GFRNONAA >60 04/12/2017 0719   GFRAA >60 04/12/2017 0719   CBC    Component Value Date/Time   WBC 12.5 (H) 04/12/2017 0719   RBC 3.84 (L) 04/12/2017 0719   HGB 11.7 (L) 04/12/2017 0719   HCT 35.3 (L) 04/12/2017 0719   PLT 637 (H) 04/12/2017 0719   MCV 91.9 04/12/2017 0719   MCH 30.5 04/12/2017 0719   MCHC 33.1 04/12/2017 0719   RDW 14.5 04/12/2017 0719   LYMPHSABS 2.6 04/12/2017 0719   MONOABS 1.0 04/12/2017 0719   EOSABS 0.4 04/12/2017 0719   BASOSABS 0.1 04/12/2017 0719    Studies/Results: Dg Chest 2 View  Result Date: 04/14/2017 CLINICAL DATA:  Leukocytosis EXAM: CHEST  2 VIEW COMPARISON:  April 10, 2017 FINDINGS: There is atelectatic change in the right middle lobe. Lungs elsewhere are clear. Heart size  and pulmonary vascularity are normal. No adenopathy. No evident bone lesions. IMPRESSION: Right middle lobe atelectasis. No edema or consolidation. Stable cardiac silhouette. Electronically Signed   By: Bretta BangWilliam  Woodruff III M.D.   On: 04/14/2017 09:34    Medications: I have reviewed the patient's current medications.  Assessment/Plan:  1. Decreased functional mobilitysecondary to anterior inferior left C6 vertebral corner fracture as well as widening of C6-7 facet joint with capsule disruption and ligamentous injury, C8 and T1 nerve root avulsion with right brachial plexus injury, cervical collar at all times, pelvic ring fracture with zone 3 fracture, right SI diastasis as well as left open fibular fracture status post ORIF  anterior entrance sacral screw for sacral fracture  Stable for discharge.    Length of stay, days: 10  Rogelia BogaKWIATKOWSKI,Mervil Wacker FRANK , MD 04/15/2017, 8:56 AM
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
## 2017-05-04 ENCOUNTER — Ambulatory Visit (HOSPITAL_BASED_OUTPATIENT_CLINIC_OR_DEPARTMENT_OTHER): Payer: Commercial Managed Care - PPO | Admitting: Physical Medicine & Rehabilitation

## 2017-05-04 ENCOUNTER — Telehealth: Payer: Self-pay | Admitting: *Deleted

## 2017-05-04 ENCOUNTER — Encounter: Payer: Self-pay | Admitting: Physical Medicine & Rehabilitation

## 2017-05-04 ENCOUNTER — Encounter: Payer: Commercial Managed Care - PPO | Attending: Physical Medicine & Rehabilitation

## 2017-05-04 VITALS — BP 114/79 | HR 114 | Resp 14

## 2017-05-04 DIAGNOSIS — S12600S Unspecified displaced fracture of seventh cervical vertebra, sequela: Secondary | ICD-10-CM | POA: Diagnosis not present

## 2017-05-04 DIAGNOSIS — S32591S Other specified fracture of right pubis, sequela: Secondary | ICD-10-CM | POA: Diagnosis not present

## 2017-05-04 DIAGNOSIS — X58XXXS Exposure to other specified factors, sequela: Secondary | ICD-10-CM | POA: Diagnosis not present

## 2017-05-04 DIAGNOSIS — S3210XD Unspecified fracture of sacrum, subsequent encounter for fracture with routine healing: Secondary | ICD-10-CM | POA: Diagnosis not present

## 2017-05-04 DIAGNOSIS — S143XXS Injury of brachial plexus, sequela: Secondary | ICD-10-CM

## 2017-05-04 MED ORDER — METHOCARBAMOL 500 MG PO TABS: 500.0000 mg | ORAL_TABLET | Freq: Three times a day (TID) | ORAL | 0 refills | Status: AC

## 2017-05-04 MED ORDER — WARFARIN SODIUM 5 MG PO TABS: 7.5000 mg | ORAL_TABLET | Freq: Every day | ORAL | 0 refills | Status: DC

## 2017-05-04 MED ORDER — GABAPENTIN 300 MG PO CAPS: 600.0000 mg | ORAL_CAPSULE | Freq: Three times a day (TID) | ORAL | 1 refills | Status: AC

## 2017-05-04 MED ORDER — TRAMADOL HCL 50 MG PO TABS: 50.0000 mg | ORAL_TABLET | Freq: Three times a day (TID) | ORAL | 0 refills | Status: DC

## 2017-05-04 NOTE — Telephone Encounter (Signed)
Notified. 
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
## 2017-05-04 NOTE — Telephone Encounter (Signed)
Left message with Dr Magdalene PatriciaHandy's office about how long he needs to be on coumadin.  Left message.  Dr Magdalene PatriciaHandy's office returned our call concerning coumadin on Mr Ricardo Friedman.  He would like for him to be on the medication 2 more weeks.

## 2017-05-04 NOTE — Patient Instructions (Signed)
May discontinue Urecholine, causing excessive urination May discontinue Protonix, no longer having stomach issues

## 2017-05-04 NOTE — Telephone Encounter (Signed)
Ok I wrote for a 20d supply , please call pt to inform that last day of warfarin is 8/17

## 2017-05-08 ENCOUNTER — Inpatient Hospital Stay: Payer: Commercial Managed Care - PPO | Admitting: Physical Medicine & Rehabilitation

## 2017-06-01 ENCOUNTER — Encounter: Payer: Self-pay | Admitting: Physical Medicine & Rehabilitation

## 2017-06-01 ENCOUNTER — Ambulatory Visit (HOSPITAL_BASED_OUTPATIENT_CLINIC_OR_DEPARTMENT_OTHER): Payer: Commercial Managed Care - PPO | Admitting: Physical Medicine & Rehabilitation

## 2017-06-01 VITALS — BP 105/69 | HR 94 | Resp 14

## 2017-06-01 DIAGNOSIS — S32811B Multiple fractures of pelvis with unstable disruption of pelvic ring, initial encounter for open fracture: Secondary | ICD-10-CM

## 2017-06-01 DIAGNOSIS — N3941 Urge incontinence: Secondary | ICD-10-CM | POA: Diagnosis not present

## 2017-06-01 DIAGNOSIS — S32591S Other specified fracture of right pubis, sequela: Secondary | ICD-10-CM | POA: Diagnosis not present

## 2017-06-01 DIAGNOSIS — S143XXS Injury of brachial plexus, sequela: Secondary | ICD-10-CM | POA: Diagnosis not present

## 2017-06-01 DIAGNOSIS — S12600S Unspecified displaced fracture of seventh cervical vertebra, sequela: Secondary | ICD-10-CM | POA: Diagnosis not present

## 2017-06-01 DIAGNOSIS — N5239 Other post-surgical erectile dysfunction: Secondary | ICD-10-CM | POA: Diagnosis not present

## 2017-06-01 MED ORDER — TRAMADOL HCL 50 MG PO TABS
50.0000 mg | ORAL_TABLET | Freq: Three times a day (TID) | ORAL | 0 refills | Status: AC
Start: 2017-06-01 — End: ?
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
